# Patient Record
Sex: Female | Born: 1960
Health system: Southern US, Community
[De-identification: ages and names within clinical notes are randomized; demographics above are authoritative.]

## PROBLEM LIST (undated history)

## (undated) DIAGNOSIS — E785 Hyperlipidemia, unspecified: Secondary | ICD-10-CM

## (undated) DIAGNOSIS — M1990 Unspecified osteoarthritis, unspecified site: Secondary | ICD-10-CM

## (undated) DIAGNOSIS — I10 Essential (primary) hypertension: Secondary | ICD-10-CM

## (undated) DIAGNOSIS — R5383 Other fatigue: Secondary | ICD-10-CM

## (undated) DIAGNOSIS — E1140 Type 2 diabetes mellitus with diabetic neuropathy, unspecified: Secondary | ICD-10-CM

## (undated) DIAGNOSIS — R519 Headache, unspecified: Secondary | ICD-10-CM

## (undated) DIAGNOSIS — R51 Headache: Secondary | ICD-10-CM

## (undated) HISTORY — DX: Hyperlipidemia, unspecified: E78.5

## (undated) HISTORY — DX: Headache, unspecified: R51.9

## (undated) HISTORY — DX: Headache: R51

## (undated) HISTORY — DX: Type 2 diabetes mellitus with diabetic neuropathy, unspecified: E11.40

## (undated) HISTORY — DX: Other fatigue: R53.83

## (undated) HISTORY — DX: Essential (primary) hypertension: I10

## (undated) HISTORY — DX: Unspecified osteoarthritis, unspecified site: M19.90

---

## 2000-12-14 ENCOUNTER — Other Ambulatory Visit: Admission: RE | Admit: 2000-12-14 | Discharge: 2000-12-14 | Payer: Self-pay | Admitting: Gynecology

## 2002-03-03 ENCOUNTER — Other Ambulatory Visit: Admission: RE | Admit: 2002-03-03 | Discharge: 2002-03-03 | Payer: Self-pay | Admitting: Gynecology

## 2002-07-17 ENCOUNTER — Encounter (INDEPENDENT_AMBULATORY_CARE_PROVIDER_SITE_OTHER): Payer: Self-pay | Admitting: Specialist

## 2002-07-17 ENCOUNTER — Inpatient Hospital Stay (HOSPITAL_COMMUNITY): Admission: RE | Admit: 2002-07-17 | Discharge: 2002-07-19 | Payer: Self-pay | Admitting: Gynecology

## 2002-07-31 HISTORY — PX: ABDOMINAL HYSTERECTOMY: SHX81

## 2003-06-10 ENCOUNTER — Other Ambulatory Visit: Admission: RE | Admit: 2003-06-10 | Discharge: 2003-06-10 | Payer: Self-pay | Admitting: Gynecology

## 2006-02-21 ENCOUNTER — Other Ambulatory Visit: Admission: RE | Admit: 2006-02-21 | Discharge: 2006-02-21 | Payer: Self-pay | Admitting: Gynecology

## 2009-07-20 ENCOUNTER — Encounter: Admission: RE | Admit: 2009-07-20 | Discharge: 2009-07-20 | Payer: Self-pay | Admitting: Gynecology

## 2010-07-26 ENCOUNTER — Encounter
Admission: RE | Admit: 2010-07-26 | Discharge: 2010-07-26 | Payer: Self-pay | Source: Home / Self Care | Attending: Gynecology | Admitting: Gynecology

## 2010-08-21 ENCOUNTER — Encounter: Payer: Self-pay | Admitting: Gynecology

## 2010-12-16 NOTE — Op Note (Signed)
Barbara Forbes, Barbara Forbes                      ACCOUNT NO.:  [id number]   MEDICAL RECORD NO.:  [id number]                   PATIENT TYPE:  INP   LOCATION:  0453                                 FACILITY:  [facility]   PHYSICIAN:  Gretta Cool, M.D.              DATE OF BIRTH:  [birth year]   DATE OF PROCEDURE:  07/17/2002  DATE OF DISCHARGE:                                 OPERATIVE REPORT   SURGEON:  Gretta Cool, M.D.   ASSISTANT:  Raynald Kemp, M.D.   PREOPERATIVE DIAGNOSIS:  Uterine leiomyomata with abnormal uterine bleeding,  submucosal transmural myomata present.   POSTOPERATIVE DIAGNOSIS:  Uterine leiomyomata with abnormal uterine  bleeding, submucosal transmural myomata present.   PROCEDURE:  Supracervical hysterectomy.   ANESTHESIA:  General orotracheal.   DESCRIPTION OF PROCEDURE:  Under excellent general anesthesia as above, a  Pfannenstiel incision was made by excision of her previous scar.  The  incision was then extended through the fascia.  The rectus muscles were  separated in the midline, and the peritoneum opened.  There were no  significant adhesions from her previous cesarean section delivery.  A large  leiomyomata was present in the posterior wall of the uterus fundal wall.  There was no other significant pelvic or abdominal pathology.  At this  point, the uterus was elevated through the incision.  The round ligaments  sutured and transected.  The anterior leaf of the broad ligament was then  pushed off the lower uterine segment and the bladder was dissected free from  the cervix.  The ovarian ligaments on each side were then clamped, cut,  sutured, and tied with 0 Vicryl.  The uterine vessels were then  skeletonized, clamped, cut, sutured, and tied with 0 Vicryl.  At this point,  the cervix was incised in a conical incision so as to remove most of the  endocervical canal.  The remaining endocervical mucosa was then treated by  cautery.  At this point,  the cervix was approximated anterior to posterior  with a running mattress suture of 0 Vicryl.  At this point, all of the  bleeding was well controlled, it was dried.  The pelvis was irrigated with  lactated ringers.  The pelvic peritoneum was re-peritonealized with running  suture of #2-0 Vicryl.  The packs and retractors were then removed.  Pelvis  irrigated with lactated ringers.  There was no evidence of endometriosis or  other pathology.  The abdominoperitoneum was closed with a running suture of  #0 Monocryl.  The rectus muscles were plicated with 0 Monocryl as well.  The  rectus fascia was then approximated with running sutures of 0 Vicryl from  each angle to the midline.  Subcutaneous  tissues were approximated with interrupted sutures of 3-0 Vicryl.  Skin  closed with skin staples and Steri-Strips, and intermittent sutures of  interrupted mattress type.  At this point, the patient was returned to  the  recovery room in excellent condition without complication.                                                Gretta Cool, M.D.    CWL/MEDQ  D:  07/17/2002  T:  07/17/2002  Job:  [id number]   cc:   Donia Guiles, M.D.  [address] [place]  [place] [address]  Fax: [phone]

## 2010-12-16 NOTE — H&P (Signed)
NAMEDANAH, REINECKE                      ACCOUNT NO.:  [id number]   MEDICAL RECORD NO.:  [id number]                   PATIENT TYPE:  INP   LOCATION:  0453                                 FACILITY:  [facility]   PHYSICIAN:  Gretta Cool, M.D.              DATE OF BIRTH:  [birth year]   DATE OF ADMISSION:  07/17/2002  DATE OF DISCHARGE:                                HISTORY & PHYSICAL   CHIEF COMPLAINT:  Abnormal uterine bleeding.   HISTORY OF PRESENT ILLNESS:  The patient is a 50 year old gravida 1, para 1,  with type 1 diabetes since childhood.  She has over the last several years  developed increasingly severe menorrhagia, now lasting nine days.  She has  flow that occasionally comes through her clothing and her bed clothing.  She  also has severe discomfort with menstrual cycle, and wishes on to definitive  therapy.  We discussed all alternatives, including hysteroscopic resection  of uterine leiomyoma versus abdominal hysterectomy.   Her diabetes is under control, management by Dr. Corrin Parker, with multi-  dose insulin regimen.  He will follow her intraoperatively and  postoperatively.   PAST MEDICAL HISTORY:  1. Usual childhood disease without sequela.  2. Diabetes as above, type 1 since childhood.   PAST SURGICAL HISTORY:  1. Cesarean section delivery in 1989 for her only pregnancy.  2. Other hospitalizations for diabetes only.   FAMILY HISTORY:  Father died at age 16 of lung cancer.  Mother is alive and  well.   SOCIAL HISTORY:  She has a 40 year old child at home.  Husband is employed  by [employer].  She is a Education officer, environmental at [employer].   REVIEW OF SYMPTOMS:  HEENT:  Denies symptoms.  CARDIORESPIRATORY:  Denies  asthma, cough, black-out, or shortness of breath.  GASTROINTESTINAL:  Denies  frequency, urgency, dysuria, change in bowel habits, food intolerance.   PHYSICAL EXAMINATION:  GENERAL:  A well-developed, well-nourished white  female.  HEENT:  Pupils equal, round, reactive to light and accommodation.  Fundi are  benign.  Oropharynx clear.  NECK:  Supple without mass or thyromegaly.  CHEST:  Clear to auscultation and percussion.  BREASTS:  Soft without mass, nodes, or nipple discharge.  HEART:  Regular rhythm without murmur or cardiac enlargement.  ABDOMEN:  Soft, scaphoid without mass or organomegaly.  PELVIC:  External genitalia, normal female, vagina clean and rugous.  Cervix  is nulliparous.  Uterus is normal size, shape, contour.  Adnexa clear.  Rectovaginal confirms.  EXTREMITIES:  Negative.  NEUROLOGIC:  Physiologic.    IMPRESSION:  1. Uterine leiomyomata, transmural, probably non-resectable by hysteroscopic     approach.  2. Abnormal uterine bleeding secondary to #1.  3. Insulin-dependent diabetes type 1.   PLAN:  Total abdominal hysterectomy versus supracervical hysterectomy,  possible bilateral salpingo-oophorectomy.  Postoperative management of  diabetes by Dr. Dagoberto Ligas and his associates.  Gretta Cool, M.D.    CWL/MEDQ  D:  07/17/2002  T:  07/17/2002  Job:  [id number]   cc:   Alfonse Alpers. Dagoberto Ligas, M.D.  [address], [address]  [place]  [place] [address]  Fax: [phone]   Renae Fickle  [address]  [place]  [place] [address]  Fax: [phone]

## 2010-12-16 NOTE — Discharge Summary (Signed)
Barbara Forbes, Barbara Forbes                      ACCOUNT NO.:  [id number]   MEDICAL RECORD NO.:  [id number]                   PATIENT TYPE:  INP   LOCATION:  0453                                 FACILITY:  [facility]   PHYSICIAN:  Gretta Cool, M.D.              DATE OF BIRTH:  24-May-1961   DATE OF ADMISSION:  07/17/2002  DATE OF DISCHARGE:  07/19/2002                                 DISCHARGE SUMMARY   HISTORY OF PRESENT ILLNESS:  The patient is a 50 year old female, gravida 1,  para 1, who has developed over the last several years increasingly severe  menorrhagia, now lasting nine days.  She describes the flow as coming  through her clothing as well as her bed linen.  She also reports severe  discomfort with her menstrual cycles and wishes definitive therapy.  She is  a type 1 diabetic since childhood.  Discussion of treatments was done  including hysteroscopy, resection of the uterine leiomyomata versus  abdominal hysterectomy.   ADMISSION PHYSICAL EXAMINATION:  CHEST:  Clear to A&P.  HEART:  Heart rate and rhythm were regular.  Without murmur, gallop, or  cardiac enlargement.  ABDOMEN:  Soft and scaphoid.  Without masses or organomegaly.  PELVIC:  External genitalia within normal limits for female.  Vagina clean  and rugose.  Cervix is nulliparous and clean.  Uterus is normal shape, size,  and contour.  Adnexa bilaterally clear.  RECTOVAGINAL:  Confirms.   IMPRESSION:  1. Uterine leiomyomata, transmural, probably nonresectable by hysteroscopic     approach.  2. Abnormal uterine bleeding secondary to #1.  3. Insulin-dependent diabetes type 1.   PLAN:  Total abdominal hysterectomy versus supracervical hysterectomy,  possible bilateral salpingo-oophorectomy under general anesthesia.  Postoperative management of diabetes will be done by Dr. Dagoberto Ligas and his  associates.   LABORATORY DATA:  Admission hemoglobin 13.8, hematocrit 39.6.  On the first  postoperative day hemoglobin  was 13.2, hematocrit 38.9.  Routine  chemistries:  Preoperative glucose was elevated at 284.  Hemoglobin A1C was  8.  Urine pregnancy test negative.  Urine within normal limits with the  exception of glucose of greater than 1000.   EKG:  Normal.   HOSPITAL COURSE:  The patient underwent supracervical hysterectomy for  uterine leiomyomata with abnormal uterine bleeding, leiomyomata was  submucosal transmural.  The procedure was completed without any  complications, and the patient was returned to the recovery room in  excellent condition.  Pathology report:  Benign endocervix, benign secretory  endometrium, benign endometrial polyp without hyperplasia or malignancy  identified.  Intramural leiomyomata bulging into the cavity clinically.  Benign uterine serosa.  No endometriosis or malignancy identified.  Her  postoperative course was without complications, and she was discharged on  the second postoperative day in excellent condition.  On the day of  discharge her diabetes was well controlled, she was afebrile, and doing  well.   DISCHARGE INSTRUCTIONS:  Postoperative instructions included no heavy  lifting or straining, no vaginal entrance, increase ambulation as tolerated.  She is to call for any fever of over 100.5 or failure of daily improvement.   DIET:  Regular.   MEDICATIONS:  1. Vioxx 25 mg daily.  2. Tylox 1 p.o. q.4h. p.r.n. discomfort.   FOLLOW-UP:  She is to follow up in the office on July 21, 2002.   CONDITION ON DISCHARGE:  Excellent.   FINAL DISCHARGE DIAGNOSES:  1. Uterine leiomyomata with abnormal uterine bleeding.  2. Diabetes type 1.   PROCEDURE:  Supracervical hysterectomy under general anesthesia.     Matt Holmes, N.P.                          Gretta Cool, M.D.    EMK/MEDQ  D:  08/18/2002  T:  08/18/2002  Job:  [id number]   cc:   Donia Guiles, M.D.  [address] [place]  [place] [address]  Fax: [phone]   Alfonse Alpers. Dagoberto Ligas, M.D.   [address]  [place]  [place] [address]  Fax: [phone]   Renae Fickle  [address]  [place]  [place] [address]  Fax: [phone]

## 2011-02-17 ENCOUNTER — Other Ambulatory Visit: Payer: Self-pay | Admitting: Gynecology

## 2011-06-28 ENCOUNTER — Other Ambulatory Visit: Payer: Self-pay | Admitting: Gynecology

## 2011-06-28 DIAGNOSIS — Z1231 Encounter for screening mammogram for malignant neoplasm of breast: Secondary | ICD-10-CM

## 2011-07-28 ENCOUNTER — Ambulatory Visit: Payer: Self-pay

## 2011-08-18 ENCOUNTER — Ambulatory Visit
Admission: RE | Admit: 2011-08-18 | Discharge: 2011-08-18 | Disposition: A | Discharge status: Home / Self Care | Payer: BC Managed Care – PPO | Source: Ambulatory Visit | Attending: Gynecology | Admitting: Gynecology

## 2011-08-18 DIAGNOSIS — Z1231 Encounter for screening mammogram for malignant neoplasm of breast: Secondary | ICD-10-CM

## 2011-10-18 ENCOUNTER — Encounter: Payer: Self-pay | Admitting: Cardiovascular Disease

## 2011-12-27 ENCOUNTER — Ambulatory Visit: Payer: BC Managed Care – PPO | Admitting: Cardiology

## 2012-01-23 ENCOUNTER — Encounter: Payer: Self-pay | Admitting: Cardiovascular Disease

## 2012-01-23 ENCOUNTER — Ambulatory Visit (INDEPENDENT_AMBULATORY_CARE_PROVIDER_SITE_OTHER): Payer: BC Managed Care – PPO | Admitting: Cardiovascular Disease

## 2012-01-23 VITALS — BP 137/79 | HR 89 | Ht 62.0 in | Wt 161.0 lb

## 2012-01-23 DIAGNOSIS — R002 Palpitations: Secondary | ICD-10-CM | POA: Insufficient documentation

## 2012-01-23 DIAGNOSIS — R079 Chest pain, unspecified: Secondary | ICD-10-CM | POA: Insufficient documentation

## 2012-01-23 NOTE — Assessment & Plan Note (Signed)
She is a juvenile diabetic and has symptoms of chest pain that could represent angina. EKG is normal. Will arrange stress echo to exclude ischemia. I think imaging is important in addition to the stress EKG given her diabetes.

## 2012-01-23 NOTE — Progress Notes (Signed)
   History of Present Illness: 51 yo female with history of HTN, juvenile DM, gastroparesis, OA, HLD who is here today for evaluation of chest pressure, dizziness and arm numbness. She tells me that she has dizziness during the day for several months. This happens if she rushes around or goes on a long walk. She also describes pressure in the center of her chest while at rest and occasionally with exertion with associated numbness in her arms. Normal stress test in 1995. She also notes palpitations at night. She feels her heart racing.   Primary Care Physician: Renae Fickle  Past Medical History  Diagnosis Date  . Hypertension   . Head ache   . Fatigue   . Arthritis   . Diabetes mellitus     since age 54  . Hyperlipidemia   . Diabetic neuropathy     Past Surgical History  Procedure Date  . Cesarean section 1989  . Abdominal hysterectomy 2004    Current Outpatient Prescriptions  Medication Sig Dispense Refill  . aspirin 81 MG tablet Take 81 mg by mouth daily.      . Insulin Zinc Human (NOVOLIN L [place]) Inject into the skin. AS DIRECTED      . metoCLOPramide (REGLAN) 5 MG tablet AS NEEDED      . rosuvastatin (CRESTOR) 10 MG tablet Take 10 mg by mouth daily.      . valsartan (DIOVAN) 160 MG tablet Take 160 mg by mouth daily.        Allergies  Allergen Reactions  . Penicillins   . Sulfa Antibiotics     History   Social History  . Marital Status: Single    Spouse Name: N/A    Number of Children: 1  . Years of Education: N/A   Occupational History  . English Instructor/GED instructor    Social History Main Topics  . Smoking status: Never Smoker   . Smokeless tobacco: Not on file  . Alcohol Use: No  . Drug Use: No  . Sexually Active: Not on file   Other Topics Concern  . Not on file   Social History Narrative  . No narrative on file    Family History  Problem Relation Age of Onset  . Cancer Father     Lung/brain  . Stroke Mother   . Lupus Mother   . Coronary  artery disease Father     Review of Systems:  As stated in the HPI and otherwise negative.   BP 137/79  Pulse 89  Ht 5\' 2"  (1.575 m)  Wt 161 lb (73.029 kg)  BMI 29.45 kg/m2  Physical Examination: General: Well developed, well nourished, NAD HEENT: OP clear, mucus membranes moist SKIN: warm, dry. No rashes. Neuro: No focal deficits Musculoskeletal: Muscle strength 5/5 all ext Psychiatric: Mood and affect normal Neck: No JVD, no carotid bruits, no thyromegaly, no lymphadenopathy. Lungs:Clear bilaterally, no wheezes, rhonci, crackles Cardiovascular: Regular rate and rhythm. No murmurs, gallops or rubs. Abdomen:Soft. Bowel sounds present. Non-tender.  Extremities: No lower extremity edema. Pulses are 2 + in the bilateral DP/PT.  EKG: NSR, rate 89 bpm. Normal EKG

## 2012-01-23 NOTE — Patient Instructions (Signed)
Your physician recommends that you schedule a follow-up appointment in: 4-5 weeks.   Your physician has requested that you have a stress echocardiogram. For further information please visit [email or web address] Please follow instruction sheet as given.   Your physician has recommended that you wear a holter monitor. Holter monitors are medical devices that record the heart's electrical activity. Doctors most often use these monitors to diagnose arrhythmias. Arrhythmias are problems with the speed or rhythm of the heartbeat. The monitor is a small, portable device. You can wear one while you do your normal daily activities. This is usually used to diagnose what is causing palpitations/syncope (passing out).

## 2012-01-23 NOTE — Assessment & Plan Note (Signed)
Will arrange 48 hour monitor.

## 2012-02-13 ENCOUNTER — Encounter (INDEPENDENT_AMBULATORY_CARE_PROVIDER_SITE_OTHER): Payer: BC Managed Care – PPO

## 2012-02-13 DIAGNOSIS — R002 Palpitations: Secondary | ICD-10-CM

## 2012-02-16 ENCOUNTER — Encounter: Payer: Self-pay | Admitting: Cardiovascular Disease

## 2012-02-16 ENCOUNTER — Ambulatory Visit (HOSPITAL_COMMUNITY): Payer: BC Managed Care – PPO | Attending: Cardiovascular Disease | Admitting: Radiology

## 2012-02-16 ENCOUNTER — Telehealth: Payer: Self-pay | Admitting: Cardiovascular Disease

## 2012-02-16 ENCOUNTER — Ambulatory Visit (HOSPITAL_COMMUNITY): Payer: BC Managed Care – PPO

## 2012-02-16 DIAGNOSIS — I10 Essential (primary) hypertension: Secondary | ICD-10-CM | POA: Insufficient documentation

## 2012-02-16 DIAGNOSIS — E785 Hyperlipidemia, unspecified: Secondary | ICD-10-CM | POA: Insufficient documentation

## 2012-02-16 DIAGNOSIS — Z8249 Family history of ischemic heart disease and other diseases of the circulatory system: Secondary | ICD-10-CM | POA: Insufficient documentation

## 2012-02-16 DIAGNOSIS — R209 Unspecified disturbances of skin sensation: Secondary | ICD-10-CM | POA: Insufficient documentation

## 2012-02-16 DIAGNOSIS — R072 Precordial pain: Secondary | ICD-10-CM | POA: Insufficient documentation

## 2012-02-16 DIAGNOSIS — R002 Palpitations: Secondary | ICD-10-CM | POA: Insufficient documentation

## 2012-02-16 DIAGNOSIS — R5383 Other fatigue: Secondary | ICD-10-CM | POA: Insufficient documentation

## 2012-02-16 DIAGNOSIS — R5381 Other malaise: Secondary | ICD-10-CM | POA: Insufficient documentation

## 2012-02-16 DIAGNOSIS — R42 Dizziness and giddiness: Secondary | ICD-10-CM | POA: Insufficient documentation

## 2012-02-16 DIAGNOSIS — R000 Tachycardia, unspecified: Secondary | ICD-10-CM | POA: Insufficient documentation

## 2012-02-16 DIAGNOSIS — R079 Chest pain, unspecified: Secondary | ICD-10-CM | POA: Insufficient documentation

## 2012-02-16 DIAGNOSIS — E119 Type 2 diabetes mellitus without complications: Secondary | ICD-10-CM | POA: Insufficient documentation

## 2012-02-16 DIAGNOSIS — M79609 Pain in unspecified limb: Secondary | ICD-10-CM | POA: Insufficient documentation

## 2012-02-16 NOTE — Progress Notes (Signed)
Echocardiogram performed.  

## 2012-02-16 NOTE — Telephone Encounter (Signed)
Patient called no answer.LMTC. 

## 2012-02-16 NOTE — Telephone Encounter (Signed)
New problem:  Patient calling - test results  

## 2012-02-16 NOTE — Telephone Encounter (Signed)
Advised pt that since her test was just completed 4 hours ago the physician has not had an opportunity to fully review and interpret it at this point.  Advised her that we will definitely call her as soon as the results are available.  Pt agreed.

## 2012-02-20 ENCOUNTER — Telehealth: Payer: Self-pay | Admitting: Cardiovascular Disease

## 2012-02-20 NOTE — Telephone Encounter (Signed)
Fu call °Pt returning your call  °

## 2012-02-20 NOTE — Telephone Encounter (Signed)
Spoke with patient.  She is aware of stress echo results and will keep her follow up appointment

## 2012-02-22 ENCOUNTER — Telehealth: Payer: Self-pay

## 2012-02-22 NOTE — Telephone Encounter (Signed)
Pt was called per Dr Clifton James and given holder monitor results which were:  NSR, rare pac's and one short run of SVT (7 beats).

## 2012-03-07 ENCOUNTER — Encounter: Payer: Self-pay | Admitting: Cardiovascular Disease

## 2012-03-07 ENCOUNTER — Ambulatory Visit (INDEPENDENT_AMBULATORY_CARE_PROVIDER_SITE_OTHER): Payer: BC Managed Care – PPO | Admitting: Cardiovascular Disease

## 2012-03-07 VITALS — BP 146/85 | HR 100 | Ht 63.0 in | Wt 163.0 lb

## 2012-03-07 DIAGNOSIS — R079 Chest pain, unspecified: Secondary | ICD-10-CM

## 2012-03-07 DIAGNOSIS — R002 Palpitations: Secondary | ICD-10-CM

## 2012-03-07 MED ORDER — DILTIAZEM HCL ER COATED BEADS 120 MG PO CP24: 120.0000 mg | ORAL_CAPSULE | Freq: Every day | ORAL | Status: DC

## 2012-03-07 NOTE — Assessment & Plan Note (Signed)
Likely secondary to PACs and short runs of SVT. Will start Cardizem CD 120 mg po QDaily. She will call if there is a change in symptoms.

## 2012-03-07 NOTE — Progress Notes (Signed)
History of Present Illness: 51 yo female with history of HTN, juvenile DM, gastroparesis, OA, HLD who is here today for cardiac follow up. She was seen as a new patient in June 2013 for evaluation of chest pressure, dizziness and arm numbness. She told me that she has dizziness during the day for several months. This happens if she rushes around or goes on a long walk. She also describes pressure in the center of her chest while at rest and occasionally with exertion with associated numbness in her arms. Normal stress test in 1995. She also notes palpitations at night. She feels her heart racing. I arranged a stress echo which was normal and a 48 hour monitor which showed rare PACs with NSR and one 7 beat run of SVT.   She is here today for follow up. She is feeling well. No recurrent chest pain. Still feeling dizzy at times with occasional palpitations.   Primary Care Physician: Renae Fickle  Past Medical History  Diagnosis Date  . Hypertension   . Head ache   . Fatigue   . Arthritis   . Diabetes mellitus     since age 57  . Hyperlipidemia   . Diabetic neuropathy     Past Surgical History  Procedure Date  . Cesarean section 1989  . Abdominal hysterectomy 2004    Current Outpatient Prescriptions  Medication Sig Dispense Refill  . aspirin 81 MG tablet Take 81 mg by mouth daily.      . Insulin Zinc Human (NOVOLIN L [place]) Inject into the skin. AS DIRECTED      . metoCLOPramide (REGLAN) 5 MG tablet AS NEEDED      . rosuvastatin (CRESTOR) 10 MG tablet Take 10 mg by mouth daily.      . valsartan (DIOVAN) 160 MG tablet Take 160 mg by mouth daily.        Allergies  Allergen Reactions  . Penicillins   . Sulfa Antibiotics     History   Social History  . Marital Status: Single    Spouse Name: N/A    Number of Children: 1  . Years of Education: N/A   Occupational History  . English Instructor/GED instructor    Social History Main Topics  . Smoking status: Never Smoker   .  Smokeless tobacco: Not on file  . Alcohol Use: No  . Drug Use: No  . Sexually Active: Not on file   Other Topics Concern  . Not on file   Social History Narrative  . No narrative on file    Family History  Problem Relation Age of Onset  . Cancer Father     Lung/brain  . Stroke Mother   . Lupus Mother   . Coronary artery disease Father     Review of Systems:  As stated in the HPI and otherwise negative.   BP 146/85  Pulse 100  Ht 5\' 3"  (1.6 m)  Wt 163 lb (73.936 kg)  BMI 28.87 kg/m2  Physical Examination: General: Well developed, well nourished, NAD HEENT: OP clear, mucus membranes moist SKIN: warm, dry. No rashes. Neuro: No focal deficits Musculoskeletal: Muscle strength 5/5 all ext Psychiatric: Mood and affect normal Neck: No JVD, no carotid bruits, no thyromegaly, no lymphadenopathy. Lungs:Clear bilaterally, no wheezes, rhonci, crackles Cardiovascular: Regular rate and rhythm. No murmurs, gallops or rubs. Abdomen:Soft. Bowel sounds present. Non-tender.  Extremities: No lower extremity edema. Pulses are 2 + in the bilateral DP/PT.  Stress echo 02/16/12:  HPI and  indications: Exertional chest pressure. - Stress ECG conclusions: There were no stress arrhythmias or conduction abnormalities. The stress ECG was negative for ischemia. - Staged echo: There was no echocardiographic evidence for stress-induced ischemia.

## 2012-03-07 NOTE — Patient Instructions (Addendum)
Your physician wants you to follow-up in: 6 months. You will receive a reminder letter in the mail two months in advance. If you don't receive a letter, please call our office to schedule the follow-up appointment.  Your physician has recommended you make the following change in your medication:  Start Cardizem CD 120 mg by mouth daily   

## 2012-03-07 NOTE — Assessment & Plan Note (Addendum)
No evidence of ischemia on stress echo. She has long standing DM but her pain is atypical and not likely cardiac. She will call if symptoms change.

## 2012-03-22 ENCOUNTER — Telehealth: Payer: Self-pay | Admitting: Cardiovascular Disease

## 2012-03-22 NOTE — Telephone Encounter (Signed)
New Problem:    Patient needs a prescription of her diltiazem (CARDIZEM CD) 120 MG 24 hr capsule called in to her pharmacies [place] location.  Please call once the order has been placed.

## 2012-03-26 ENCOUNTER — Other Ambulatory Visit: Payer: Self-pay

## 2012-03-26 DIAGNOSIS — R002 Palpitations: Secondary | ICD-10-CM

## 2012-03-26 MED ORDER — DILTIAZEM HCL ER COATED BEADS 120 MG PO CP24: 120.0000 mg | ORAL_CAPSULE | Freq: Every day | ORAL | Status: DC

## 2012-06-26 ENCOUNTER — Other Ambulatory Visit: Payer: Self-pay | Admitting: Internal Medicine

## 2012-06-26 DIAGNOSIS — R259 Unspecified abnormal involuntary movements: Secondary | ICD-10-CM

## 2012-06-26 DIAGNOSIS — R209 Unspecified disturbances of skin sensation: Secondary | ICD-10-CM

## 2012-07-03 ENCOUNTER — Other Ambulatory Visit: Payer: Self-pay | Admitting: Gynecology

## 2012-07-06 ENCOUNTER — Ambulatory Visit
Admission: RE | Admit: 2012-07-06 | Discharge: 2012-07-06 | Disposition: A | Discharge status: Home / Self Care | Payer: BC Managed Care – PPO | Source: Ambulatory Visit | Attending: Internal Medicine | Admitting: Internal Medicine

## 2012-07-06 DIAGNOSIS — R209 Unspecified disturbances of skin sensation: Secondary | ICD-10-CM

## 2012-07-06 DIAGNOSIS — R259 Unspecified abnormal involuntary movements: Secondary | ICD-10-CM

## 2012-07-06 MED ORDER — GADOBENATE DIMEGLUMINE 529 MG/ML IV SOLN
15.0000 mL | Freq: Once | INTRAVENOUS | Status: AC | PRN
  Administered 2012-07-06: 15 mL via INTRAVENOUS

## 2012-11-14 ENCOUNTER — Other Ambulatory Visit: Payer: Self-pay

## 2012-11-14 ENCOUNTER — Ambulatory Visit
Admission: RE | Admit: 2012-11-14 | Discharge: 2012-11-14 | Disposition: A | Discharge status: Home / Self Care | Payer: BC Managed Care – PPO | Source: Ambulatory Visit

## 2012-11-14 DIAGNOSIS — Z1231 Encounter for screening mammogram for malignant neoplasm of breast: Secondary | ICD-10-CM

## 2013-10-09 ENCOUNTER — Ambulatory Visit
Admission: RE | Admit: 2013-10-09 | Discharge: 2013-10-09 | Disposition: A | Discharge status: Home / Self Care | Payer: BC Managed Care – PPO | Source: Ambulatory Visit | Attending: Internal Medicine | Admitting: Internal Medicine

## 2013-10-09 ENCOUNTER — Other Ambulatory Visit: Payer: Self-pay | Admitting: Internal Medicine

## 2013-10-09 DIAGNOSIS — R1310 Dysphagia, unspecified: Secondary | ICD-10-CM

## 2013-10-09 DIAGNOSIS — Z8349 Family history of other endocrine, nutritional and metabolic diseases: Secondary | ICD-10-CM

## 2013-10-13 ENCOUNTER — Other Ambulatory Visit: Payer: Self-pay | Admitting: Internal Medicine

## 2013-10-13 DIAGNOSIS — E041 Nontoxic single thyroid nodule: Secondary | ICD-10-CM

## 2013-10-14 ENCOUNTER — Other Ambulatory Visit (HOSPITAL_COMMUNITY)
Admission: RE | Admit: 2013-10-14 | Discharge: 2013-10-14 | Disposition: A | Discharge status: Home / Self Care | Payer: BC Managed Care – PPO | Source: Ambulatory Visit | Attending: Interventional Radiology | Admitting: Interventional Radiology

## 2013-10-14 ENCOUNTER — Ambulatory Visit
Admission: RE | Admit: 2013-10-14 | Discharge: 2013-10-14 | Disposition: A | Discharge status: Home / Self Care | Payer: BC Managed Care – PPO | Source: Ambulatory Visit | Attending: Internal Medicine | Admitting: Internal Medicine

## 2013-10-14 DIAGNOSIS — E041 Nontoxic single thyroid nodule: Secondary | ICD-10-CM | POA: Insufficient documentation

## 2013-10-30 ENCOUNTER — Encounter (INDEPENDENT_AMBULATORY_CARE_PROVIDER_SITE_OTHER): Payer: Self-pay | Admitting: Surgery

## 2013-10-30 ENCOUNTER — Ambulatory Visit (INDEPENDENT_AMBULATORY_CARE_PROVIDER_SITE_OTHER): Payer: BC Managed Care – PPO | Admitting: Surgery

## 2013-10-30 DIAGNOSIS — D440 Neoplasm of uncertain behavior of thyroid gland: Secondary | ICD-10-CM

## 2013-10-30 DIAGNOSIS — E063 Autoimmune thyroiditis: Secondary | ICD-10-CM | POA: Insufficient documentation

## 2013-10-30 DIAGNOSIS — D449 Neoplasm of uncertain behavior of unspecified endocrine gland: Secondary | ICD-10-CM

## 2013-10-30 NOTE — Progress Notes (Signed)
General Surgery [facility]  Chief Complaint  Patient presents with  . New Evaluation    Thyroid nodule of undetermined significance - referral from Dr. Delrae Rend    HISTORY: Patient is a 53 year old female referred by her endocrinologist for evaluation of left thyroid nodule with mild compressive symptoms. Patient has a long-standing history of thyroid disease having undergone radioactive iodine treatment as a young woman. She has been off of thyroid medication for many years. She has now developed mild hypothyroidism. A recent ultrasound was performed on 10/09/2013. This showed a normal sized thyroid gland with inhomogeneous parenchyma. There was a dominant nodule in the lower left lobe measuring 2.3 cm in size. Fine needle aspiration biopsy was obtained. This shows a follicular lesion of undetermined significance. Aspirate was very cellular. There was a microfollicular pattern. There were some nuclear changes including nuclear enlargement and nuclear overlap. There were also areas of lymphoid inflammation and germinal centers suggestive of lymphocytic thyroiditis. The lesion was categorized as a follicular lesion of undetermined significance.  Patient has noted some mild discomfort. She denies any choking. She denies any globus sensation.  Patient has had no prior head or neck surgery. There is no family history of thyroid malignancy. There is a family history of lymphocytic thyroiditis and hypothyroidism. There is no family history of other endocrine neoplasms.  Patient is an insulin-dependent diabetic.  Past Medical History  Diagnosis Date  . Hypertension   . Head ache   . Fatigue   . Arthritis   . Diabetes mellitus     since age 45  . Hyperlipidemia   . Diabetic neuropathy     Current Outpatient Prescriptions  Medication Sig Dispense Refill  . aspirin 81 MG tablet Take 81 mg by mouth daily.      . Insulin Aspart Prot & Aspart (NOVOLOG MIX 70/30 [place]) Inject  into the skin.      . Insulin Glargine (LANTUS [place]) Inject 30 Units/L into the skin.      . Levothyroxine Sodium (SYNTHROID PO) Take by mouth.      . metoCLOPramide (REGLAN) 5 MG tablet AS NEEDED      . Multiple Vitamin (MULTIVITAMIN) tablet Take 1 tablet by mouth daily.      Marland Kitchen PRAVASTATIN SODIUM PO Take by mouth.      . diltiazem (CARDIZEM CD) 120 MG 24 hr capsule Take 1 capsule (120 mg total) by mouth daily.  30 capsule  6  . valsartan (DIOVAN) 160 MG tablet Take 160 mg by mouth daily.       No current facility-administered medications for this visit.    Allergies  Allergen Reactions  . Penicillins   . Sulfa Antibiotics     Family History  Problem Relation Age of Onset  . Cancer Father     Lung/brain  . Stroke Mother   . Lupus Mother   . Coronary artery disease Father     History   Social History  . Marital Status: Married    Spouse Name: N/A    Number of Children: 1  . Years of Education: N/A   Occupational History  . English Instructor/GED instructor    Social History Main Topics  . Smoking status: Never Smoker   . Smokeless tobacco: None  . Alcohol Use: No  . Drug Use: No  . Sexual Activity: None   Other Topics Concern  . None   Social History Narrative  . None    REVIEW OF SYSTEMS -  PERTINENT POSITIVES ONLY: Denies tremor. Denies palpitation. Minor discomfort, no dysphagia, no globus sensation.  EXAM:  GENERAL: well-developed, well-nourished, no acute distress HEENT: normocephalic; pupils equal and reactive; sclerae clear; dentition good; mucous membranes moist NECK:  Palpable nodule inferior lobe, smooth, mobile with swallowing, nontender; asymmetric on extension; no palpable anterior or posterior cervical lymphadenopathy; no supraclavicular masses; no tenderness CHEST: clear to auscultation bilaterally without rales, rhonchi, or wheezes CARDIAC: regular rate and rhythm without significant murmur; peripheral pulses are full EXT:  non-tender without  edema; no deformity NEURO: no gross focal deficits; no sign of tremor   LABORATORY RESULTS: See [facility] (CHL-Epic) for most recent results  RADIOLOGY RESULTS: See [facility] (CHL-Epic) for most recent results  IMPRESSION: #1 dominant thyroid nodule, 2.3 cm, with cytopathology showing follicular lesion of undetermined significance #2 probable underlying lymphocytic thyroiditis with mild hypothyroidism  PLAN: I discussed all the above findings at length with the patient and her friend who accompanied her today. We reviewed the cytopathology report and the ultrasound in detail. I explained to her that the follicular lesion of undetermined significance is not necessarily benign. I told her the risk of malignancy was probably in the 15-20% range.  We discussed options for management. Surgical options include thyroid lobectomy for definitive diagnosis and completion thyroidectomy if malignancy is identified.  Alternatively, we could place her on close observation with a repeat thyroid ultrasound in 6 months. Her endocrinologist will continue to monitor her TSH level and adjust her thyroid hormone dosage accordingly.  The patient and I discussed thyroid surgery at length. We discussed risk and benefits including the risk of injury to recurrent laryngeal nerves and injury to parathyroid glands. We discussed the hospital stay to be anticipated. We discussed the recovery and return to work. We discussed the potential need for radioactive iodine treatment.  Patient would like to consider these options. She will contact me if she wishes to proceed with surgery. Otherwise we will plan to see her back in 6 months with repeat thyroid ultrasound for physical examination.  Earnstine Regal, MD, [facility]  Primary Care Physician: Wende Neighbors, MD

## 2013-10-30 NOTE — Patient Instructions (Signed)

## 2013-11-17 ENCOUNTER — Other Ambulatory Visit: Payer: Self-pay

## 2013-11-17 DIAGNOSIS — Z1231 Encounter for screening mammogram for malignant neoplasm of breast: Secondary | ICD-10-CM

## 2013-11-20 ENCOUNTER — Ambulatory Visit
Admission: RE | Admit: 2013-11-20 | Discharge: 2013-11-20 | Disposition: A | Discharge status: Home / Self Care | Payer: Self-pay | Source: Ambulatory Visit

## 2013-11-20 DIAGNOSIS — Z1231 Encounter for screening mammogram for malignant neoplasm of breast: Secondary | ICD-10-CM

## 2014-10-26 ENCOUNTER — Other Ambulatory Visit: Payer: Self-pay

## 2014-10-26 DIAGNOSIS — Z1231 Encounter for screening mammogram for malignant neoplasm of breast: Secondary | ICD-10-CM

## 2014-11-30 ENCOUNTER — Ambulatory Visit
Admission: RE | Admit: 2014-11-30 | Discharge: 2014-11-30 | Disposition: A | Discharge status: Home / Self Care | Payer: BLUE CROSS/BLUE SHIELD | Source: Ambulatory Visit

## 2014-11-30 DIAGNOSIS — Z1231 Encounter for screening mammogram for malignant neoplasm of breast: Secondary | ICD-10-CM

## 2015-01-18 ENCOUNTER — Other Ambulatory Visit: Payer: Self-pay | Admitting: Obstetrics & Gynecology

## 2015-01-19 LAB — CYTOLOGY - PAP

## 2015-11-19 DIAGNOSIS — E118 Type 2 diabetes mellitus with unspecified complications: Secondary | ICD-10-CM | POA: Diagnosis not present

## 2015-11-19 DIAGNOSIS — E109 Type 1 diabetes mellitus without complications: Secondary | ICD-10-CM | POA: Diagnosis not present

## 2016-01-17 DIAGNOSIS — E041 Nontoxic single thyroid nodule: Secondary | ICD-10-CM | POA: Diagnosis not present

## 2016-01-20 ENCOUNTER — Other Ambulatory Visit: Payer: Self-pay | Admitting: Obstetrics & Gynecology

## 2016-01-20 ENCOUNTER — Other Ambulatory Visit: Payer: Self-pay

## 2016-01-20 DIAGNOSIS — Z6829 Body mass index (BMI) 29.0-29.9, adult: Secondary | ICD-10-CM | POA: Diagnosis not present

## 2016-01-20 DIAGNOSIS — N63 Unspecified lump in unspecified breast: Secondary | ICD-10-CM

## 2016-01-20 DIAGNOSIS — Z01419 Encounter for gynecological examination (general) (routine) without abnormal findings: Secondary | ICD-10-CM | POA: Diagnosis not present

## 2016-01-20 DIAGNOSIS — Z1231 Encounter for screening mammogram for malignant neoplasm of breast: Secondary | ICD-10-CM

## 2016-01-25 ENCOUNTER — Ambulatory Visit
Admission: RE | Admit: 2016-01-25 | Discharge: 2016-01-25 | Disposition: A | Discharge status: Home / Self Care | Payer: BLUE CROSS/BLUE SHIELD | Source: Ambulatory Visit | Attending: Obstetrics & Gynecology | Admitting: Obstetrics & Gynecology

## 2016-01-25 DIAGNOSIS — N6489 Other specified disorders of breast: Secondary | ICD-10-CM | POA: Diagnosis not present

## 2016-01-25 DIAGNOSIS — N63 Unspecified lump in unspecified breast: Secondary | ICD-10-CM

## 2016-01-25 DIAGNOSIS — R928 Other abnormal and inconclusive findings on diagnostic imaging of breast: Secondary | ICD-10-CM | POA: Diagnosis not present

## 2016-01-27 ENCOUNTER — Other Ambulatory Visit: Payer: BLUE CROSS/BLUE SHIELD

## 2016-02-14 DIAGNOSIS — E785 Hyperlipidemia, unspecified: Secondary | ICD-10-CM | POA: Diagnosis not present

## 2016-02-14 DIAGNOSIS — I10 Essential (primary) hypertension: Secondary | ICD-10-CM | POA: Diagnosis not present

## 2016-02-14 DIAGNOSIS — E669 Obesity, unspecified: Secondary | ICD-10-CM | POA: Diagnosis not present

## 2016-02-14 DIAGNOSIS — E109 Type 1 diabetes mellitus without complications: Secondary | ICD-10-CM | POA: Diagnosis not present

## 2016-03-14 DIAGNOSIS — E789 Disorder of lipoprotein metabolism, unspecified: Secondary | ICD-10-CM | POA: Diagnosis not present

## 2016-03-14 DIAGNOSIS — E118 Type 2 diabetes mellitus with unspecified complications: Secondary | ICD-10-CM | POA: Diagnosis not present

## 2016-03-16 DIAGNOSIS — E118 Type 2 diabetes mellitus with unspecified complications: Secondary | ICD-10-CM | POA: Diagnosis not present

## 2016-03-16 DIAGNOSIS — E032 Hypothyroidism due to medicaments and other exogenous substances: Secondary | ICD-10-CM | POA: Diagnosis not present

## 2016-03-16 DIAGNOSIS — E789 Disorder of lipoprotein metabolism, unspecified: Secondary | ICD-10-CM | POA: Diagnosis not present

## 2016-05-31 DIAGNOSIS — L219 Seborrheic dermatitis, unspecified: Secondary | ICD-10-CM | POA: Diagnosis not present

## 2016-06-08 DIAGNOSIS — E0789 Other specified disorders of thyroid: Secondary | ICD-10-CM | POA: Diagnosis not present

## 2016-06-08 DIAGNOSIS — E118 Type 2 diabetes mellitus with unspecified complications: Secondary | ICD-10-CM | POA: Diagnosis not present

## 2016-06-08 DIAGNOSIS — E789 Disorder of lipoprotein metabolism, unspecified: Secondary | ICD-10-CM | POA: Diagnosis not present

## 2016-06-15 DIAGNOSIS — E109 Type 1 diabetes mellitus without complications: Secondary | ICD-10-CM | POA: Diagnosis not present

## 2016-06-15 DIAGNOSIS — Z23 Encounter for immunization: Secondary | ICD-10-CM | POA: Diagnosis not present

## 2016-06-15 DIAGNOSIS — I10 Essential (primary) hypertension: Secondary | ICD-10-CM | POA: Diagnosis not present

## 2016-06-15 DIAGNOSIS — E032 Hypothyroidism due to medicaments and other exogenous substances: Secondary | ICD-10-CM | POA: Diagnosis not present

## 2016-07-04 DIAGNOSIS — E118 Type 2 diabetes mellitus with unspecified complications: Secondary | ICD-10-CM | POA: Diagnosis not present

## 2016-07-05 DIAGNOSIS — D225 Melanocytic nevi of trunk: Secondary | ICD-10-CM | POA: Diagnosis not present

## 2016-07-05 DIAGNOSIS — L219 Seborrheic dermatitis, unspecified: Secondary | ICD-10-CM | POA: Diagnosis not present

## 2016-07-05 DIAGNOSIS — L821 Other seborrheic keratosis: Secondary | ICD-10-CM | POA: Diagnosis not present

## 2016-07-05 DIAGNOSIS — D485 Neoplasm of uncertain behavior of skin: Secondary | ICD-10-CM | POA: Diagnosis not present

## 2016-07-10 DIAGNOSIS — E109 Type 1 diabetes mellitus without complications: Secondary | ICD-10-CM | POA: Diagnosis not present

## 2016-07-17 DIAGNOSIS — E109 Type 1 diabetes mellitus without complications: Secondary | ICD-10-CM | POA: Diagnosis not present

## 2016-08-10 DIAGNOSIS — E109 Type 1 diabetes mellitus without complications: Secondary | ICD-10-CM | POA: Diagnosis not present

## 2016-08-22 DIAGNOSIS — E109 Type 1 diabetes mellitus without complications: Secondary | ICD-10-CM | POA: Diagnosis not present

## 2016-10-06 DIAGNOSIS — E0789 Other specified disorders of thyroid: Secondary | ICD-10-CM | POA: Diagnosis not present

## 2016-10-06 DIAGNOSIS — E118 Type 2 diabetes mellitus with unspecified complications: Secondary | ICD-10-CM | POA: Diagnosis not present

## 2016-10-06 DIAGNOSIS — E789 Disorder of lipoprotein metabolism, unspecified: Secondary | ICD-10-CM | POA: Diagnosis not present

## 2016-10-11 DIAGNOSIS — E032 Hypothyroidism due to medicaments and other exogenous substances: Secondary | ICD-10-CM | POA: Diagnosis not present

## 2016-10-11 DIAGNOSIS — E109 Type 1 diabetes mellitus without complications: Secondary | ICD-10-CM | POA: Diagnosis not present

## 2016-10-11 DIAGNOSIS — E789 Disorder of lipoprotein metabolism, unspecified: Secondary | ICD-10-CM | POA: Diagnosis not present

## 2016-11-06 DIAGNOSIS — Z6830 Body mass index (BMI) 30.0-30.9, adult: Secondary | ICD-10-CM | POA: Diagnosis not present

## 2016-11-06 DIAGNOSIS — I10 Essential (primary) hypertension: Secondary | ICD-10-CM | POA: Diagnosis not present

## 2016-11-06 DIAGNOSIS — J301 Allergic rhinitis due to pollen: Secondary | ICD-10-CM | POA: Diagnosis not present

## 2016-11-06 DIAGNOSIS — E109 Type 1 diabetes mellitus without complications: Secondary | ICD-10-CM | POA: Diagnosis not present

## 2016-11-16 DIAGNOSIS — H25813 Combined forms of age-related cataract, bilateral: Secondary | ICD-10-CM | POA: Diagnosis not present

## 2016-11-16 DIAGNOSIS — E103293 Type 1 diabetes mellitus with mild nonproliferative diabetic retinopathy without macular edema, bilateral: Secondary | ICD-10-CM | POA: Diagnosis not present

## 2016-11-16 DIAGNOSIS — H4321 Crystalline deposits in vitreous body, right eye: Secondary | ICD-10-CM | POA: Diagnosis not present

## 2016-11-16 DIAGNOSIS — Z83511 Family history of glaucoma: Secondary | ICD-10-CM | POA: Diagnosis not present

## 2017-01-16 DIAGNOSIS — E032 Hypothyroidism due to medicaments and other exogenous substances: Secondary | ICD-10-CM | POA: Diagnosis not present

## 2017-01-16 DIAGNOSIS — E789 Disorder of lipoprotein metabolism, unspecified: Secondary | ICD-10-CM | POA: Diagnosis not present

## 2017-01-16 DIAGNOSIS — E109 Type 1 diabetes mellitus without complications: Secondary | ICD-10-CM | POA: Diagnosis not present

## 2017-01-17 DIAGNOSIS — E041 Nontoxic single thyroid nodule: Secondary | ICD-10-CM | POA: Diagnosis not present

## 2017-01-23 DIAGNOSIS — Z6832 Body mass index (BMI) 32.0-32.9, adult: Secondary | ICD-10-CM | POA: Diagnosis not present

## 2017-01-23 DIAGNOSIS — Z01419 Encounter for gynecological examination (general) (routine) without abnormal findings: Secondary | ICD-10-CM | POA: Diagnosis not present

## 2017-02-07 ENCOUNTER — Other Ambulatory Visit: Payer: Self-pay | Admitting: Obstetrics & Gynecology

## 2017-02-07 DIAGNOSIS — Z1231 Encounter for screening mammogram for malignant neoplasm of breast: Secondary | ICD-10-CM

## 2017-02-20 ENCOUNTER — Ambulatory Visit: Payer: BLUE CROSS/BLUE SHIELD

## 2017-02-22 ENCOUNTER — Ambulatory Visit
Admission: RE | Admit: 2017-02-22 | Discharge: 2017-02-22 | Disposition: A | Discharge status: Home / Self Care | Payer: BLUE CROSS/BLUE SHIELD | Source: Ambulatory Visit | Attending: Obstetrics & Gynecology | Admitting: Obstetrics & Gynecology

## 2017-02-22 DIAGNOSIS — Z1231 Encounter for screening mammogram for malignant neoplasm of breast: Secondary | ICD-10-CM

## 2017-03-13 DIAGNOSIS — E789 Disorder of lipoprotein metabolism, unspecified: Secondary | ICD-10-CM | POA: Diagnosis not present

## 2017-03-13 DIAGNOSIS — E118 Type 2 diabetes mellitus with unspecified complications: Secondary | ICD-10-CM | POA: Diagnosis not present

## 2017-03-13 DIAGNOSIS — E0789 Other specified disorders of thyroid: Secondary | ICD-10-CM | POA: Diagnosis not present

## 2017-03-20 DIAGNOSIS — F4323 Adjustment disorder with mixed anxiety and depressed mood: Secondary | ICD-10-CM | POA: Diagnosis not present

## 2017-05-15 DIAGNOSIS — E785 Hyperlipidemia, unspecified: Secondary | ICD-10-CM | POA: Diagnosis not present

## 2017-05-15 DIAGNOSIS — Z23 Encounter for immunization: Secondary | ICD-10-CM | POA: Diagnosis not present

## 2017-05-15 DIAGNOSIS — I10 Essential (primary) hypertension: Secondary | ICD-10-CM | POA: Diagnosis not present

## 2017-05-15 DIAGNOSIS — H6120 Impacted cerumen, unspecified ear: Secondary | ICD-10-CM | POA: Diagnosis not present

## 2017-05-15 DIAGNOSIS — E109 Type 1 diabetes mellitus without complications: Secondary | ICD-10-CM | POA: Diagnosis not present

## 2017-07-18 DIAGNOSIS — E042 Nontoxic multinodular goiter: Secondary | ICD-10-CM | POA: Diagnosis not present

## 2017-07-18 DIAGNOSIS — E1065 Type 1 diabetes mellitus with hyperglycemia: Secondary | ICD-10-CM | POA: Diagnosis not present

## 2017-07-18 DIAGNOSIS — Z794 Long term (current) use of insulin: Secondary | ICD-10-CM | POA: Diagnosis not present

## 2017-07-30 DIAGNOSIS — Z6829 Body mass index (BMI) 29.0-29.9, adult: Secondary | ICD-10-CM | POA: Diagnosis not present

## 2017-07-30 DIAGNOSIS — L02214 Cutaneous abscess of groin: Secondary | ICD-10-CM | POA: Diagnosis not present

## 2017-08-08 DIAGNOSIS — E063 Autoimmune thyroiditis: Secondary | ICD-10-CM | POA: Diagnosis not present

## 2017-08-08 DIAGNOSIS — E103293 Type 1 diabetes mellitus with mild nonproliferative diabetic retinopathy without macular edema, bilateral: Secondary | ICD-10-CM | POA: Diagnosis not present

## 2017-08-08 DIAGNOSIS — E038 Other specified hypothyroidism: Secondary | ICD-10-CM | POA: Diagnosis not present

## 2017-09-15 DIAGNOSIS — E103293 Type 1 diabetes mellitus with mild nonproliferative diabetic retinopathy without macular edema, bilateral: Secondary | ICD-10-CM | POA: Diagnosis not present

## 2017-11-08 DIAGNOSIS — E063 Autoimmune thyroiditis: Secondary | ICD-10-CM | POA: Diagnosis not present

## 2017-11-08 DIAGNOSIS — Z7984 Long term (current) use of oral hypoglycemic drugs: Secondary | ICD-10-CM | POA: Diagnosis not present

## 2017-11-08 DIAGNOSIS — E103293 Type 1 diabetes mellitus with mild nonproliferative diabetic retinopathy without macular edema, bilateral: Secondary | ICD-10-CM | POA: Diagnosis not present

## 2017-11-08 DIAGNOSIS — E038 Other specified hypothyroidism: Secondary | ICD-10-CM | POA: Diagnosis not present

## 2017-12-05 DIAGNOSIS — F419 Anxiety disorder, unspecified: Secondary | ICD-10-CM | POA: Diagnosis not present

## 2017-12-06 DIAGNOSIS — E103293 Type 1 diabetes mellitus with mild nonproliferative diabetic retinopathy without macular edema, bilateral: Secondary | ICD-10-CM | POA: Diagnosis not present

## 2017-12-06 DIAGNOSIS — H4321 Crystalline deposits in vitreous body, right eye: Secondary | ICD-10-CM | POA: Diagnosis not present

## 2017-12-06 DIAGNOSIS — H25813 Combined forms of age-related cataract, bilateral: Secondary | ICD-10-CM | POA: Diagnosis not present

## 2018-01-09 DIAGNOSIS — E063 Autoimmune thyroiditis: Secondary | ICD-10-CM | POA: Diagnosis not present

## 2018-01-09 DIAGNOSIS — E041 Nontoxic single thyroid nodule: Secondary | ICD-10-CM | POA: Diagnosis not present

## 2018-01-09 DIAGNOSIS — E038 Other specified hypothyroidism: Secondary | ICD-10-CM | POA: Diagnosis not present

## 2018-01-09 DIAGNOSIS — R5383 Other fatigue: Secondary | ICD-10-CM | POA: Diagnosis not present

## 2018-01-16 DIAGNOSIS — F419 Anxiety disorder, unspecified: Secondary | ICD-10-CM | POA: Diagnosis not present

## 2018-01-21 ENCOUNTER — Other Ambulatory Visit: Payer: Self-pay | Admitting: Obstetrics and Gynecology

## 2018-01-21 ENCOUNTER — Other Ambulatory Visit: Payer: Self-pay | Admitting: Obstetrics & Gynecology

## 2018-01-21 DIAGNOSIS — Z1231 Encounter for screening mammogram for malignant neoplasm of breast: Secondary | ICD-10-CM

## 2018-02-07 DIAGNOSIS — E782 Mixed hyperlipidemia: Secondary | ICD-10-CM | POA: Diagnosis not present

## 2018-02-07 DIAGNOSIS — E063 Autoimmune thyroiditis: Secondary | ICD-10-CM | POA: Diagnosis not present

## 2018-02-07 DIAGNOSIS — E038 Other specified hypothyroidism: Secondary | ICD-10-CM | POA: Diagnosis not present

## 2018-02-07 DIAGNOSIS — E103293 Type 1 diabetes mellitus with mild nonproliferative diabetic retinopathy without macular edema, bilateral: Secondary | ICD-10-CM | POA: Diagnosis not present

## 2018-03-11 ENCOUNTER — Ambulatory Visit: Payer: BLUE CROSS/BLUE SHIELD

## 2018-03-14 DIAGNOSIS — I10 Essential (primary) hypertension: Secondary | ICD-10-CM | POA: Diagnosis not present

## 2018-03-14 DIAGNOSIS — E559 Vitamin D deficiency, unspecified: Secondary | ICD-10-CM | POA: Diagnosis not present

## 2018-03-14 DIAGNOSIS — E538 Deficiency of other specified B group vitamins: Secondary | ICD-10-CM | POA: Diagnosis not present

## 2018-03-14 DIAGNOSIS — J029 Acute pharyngitis, unspecified: Secondary | ICD-10-CM | POA: Diagnosis not present

## 2018-03-14 DIAGNOSIS — J301 Allergic rhinitis due to pollen: Secondary | ICD-10-CM | POA: Diagnosis not present

## 2018-03-14 DIAGNOSIS — J189 Pneumonia, unspecified organism: Secondary | ICD-10-CM | POA: Diagnosis not present

## 2018-03-14 DIAGNOSIS — J019 Acute sinusitis, unspecified: Secondary | ICD-10-CM | POA: Diagnosis not present

## 2018-03-19 DIAGNOSIS — Z1231 Encounter for screening mammogram for malignant neoplasm of breast: Secondary | ICD-10-CM | POA: Diagnosis not present

## 2018-03-21 DIAGNOSIS — E103293 Type 1 diabetes mellitus with mild nonproliferative diabetic retinopathy without macular edema, bilateral: Secondary | ICD-10-CM | POA: Diagnosis not present

## 2018-05-17 DIAGNOSIS — Z23 Encounter for immunization: Secondary | ICD-10-CM | POA: Diagnosis not present

## 2018-05-17 DIAGNOSIS — R079 Chest pain, unspecified: Secondary | ICD-10-CM | POA: Diagnosis not present

## 2018-05-17 DIAGNOSIS — M542 Cervicalgia: Secondary | ICD-10-CM | POA: Diagnosis not present

## 2018-05-20 DIAGNOSIS — F4321 Adjustment disorder with depressed mood: Secondary | ICD-10-CM | POA: Diagnosis not present

## 2018-05-22 DIAGNOSIS — E109 Type 1 diabetes mellitus without complications: Secondary | ICD-10-CM | POA: Diagnosis not present

## 2018-05-22 DIAGNOSIS — G4700 Insomnia, unspecified: Secondary | ICD-10-CM | POA: Diagnosis not present

## 2018-05-22 DIAGNOSIS — F419 Anxiety disorder, unspecified: Secondary | ICD-10-CM | POA: Diagnosis not present

## 2018-05-22 DIAGNOSIS — I10 Essential (primary) hypertension: Secondary | ICD-10-CM | POA: Diagnosis not present

## 2018-05-24 DIAGNOSIS — E782 Mixed hyperlipidemia: Secondary | ICD-10-CM | POA: Diagnosis not present

## 2018-05-24 DIAGNOSIS — E063 Autoimmune thyroiditis: Secondary | ICD-10-CM | POA: Diagnosis not present

## 2018-05-24 DIAGNOSIS — E038 Other specified hypothyroidism: Secondary | ICD-10-CM | POA: Diagnosis not present

## 2018-05-24 DIAGNOSIS — E103293 Type 1 diabetes mellitus with mild nonproliferative diabetic retinopathy without macular edema, bilateral: Secondary | ICD-10-CM | POA: Diagnosis not present

## 2018-05-27 DIAGNOSIS — F4321 Adjustment disorder with depressed mood: Secondary | ICD-10-CM | POA: Diagnosis not present

## 2018-06-10 ENCOUNTER — Encounter (HOSPITAL_COMMUNITY): Payer: Self-pay | Admitting: Emergency Medicine

## 2018-06-10 ENCOUNTER — Emergency Department (HOSPITAL_COMMUNITY)
Admission: EM | Admit: 2018-06-10 | Discharge: 2018-06-11 | Disposition: A | Discharge status: Other Acute Inpatient Hospital | Payer: BLUE CROSS/BLUE SHIELD | Source: Home / Self Care | Attending: Emergency Medicine | Admitting: Emergency Medicine

## 2018-06-10 ENCOUNTER — Other Ambulatory Visit: Payer: Self-pay

## 2018-06-10 DIAGNOSIS — Z794 Long term (current) use of insulin: Secondary | ICD-10-CM | POA: Insufficient documentation

## 2018-06-10 DIAGNOSIS — F333 Major depressive disorder, recurrent, severe with psychotic symptoms: Secondary | ICD-10-CM

## 2018-06-10 DIAGNOSIS — R0789 Other chest pain: Secondary | ICD-10-CM | POA: Diagnosis not present

## 2018-06-10 DIAGNOSIS — E119 Type 2 diabetes mellitus without complications: Secondary | ICD-10-CM | POA: Insufficient documentation

## 2018-06-10 DIAGNOSIS — F22 Delusional disorders: Secondary | ICD-10-CM

## 2018-06-10 DIAGNOSIS — Z880 Allergy status to penicillin: Secondary | ICD-10-CM | POA: Diagnosis not present

## 2018-06-10 DIAGNOSIS — R05 Cough: Secondary | ICD-10-CM | POA: Diagnosis not present

## 2018-06-10 DIAGNOSIS — E785 Hyperlipidemia, unspecified: Secondary | ICD-10-CM | POA: Diagnosis not present

## 2018-06-10 DIAGNOSIS — Z79899 Other long term (current) drug therapy: Secondary | ICD-10-CM

## 2018-06-10 DIAGNOSIS — I10 Essential (primary) hypertension: Secondary | ICD-10-CM

## 2018-06-10 DIAGNOSIS — G4700 Insomnia, unspecified: Secondary | ICD-10-CM | POA: Diagnosis not present

## 2018-06-10 DIAGNOSIS — F29 Unspecified psychosis not due to a substance or known physiological condition: Secondary | ICD-10-CM

## 2018-06-10 DIAGNOSIS — F419 Anxiety disorder, unspecified: Secondary | ICD-10-CM | POA: Diagnosis not present

## 2018-06-10 DIAGNOSIS — R45851 Suicidal ideations: Secondary | ICD-10-CM

## 2018-06-10 DIAGNOSIS — Z882 Allergy status to sulfonamides status: Secondary | ICD-10-CM | POA: Diagnosis not present

## 2018-06-10 DIAGNOSIS — E1140 Type 2 diabetes mellitus with diabetic neuropathy, unspecified: Secondary | ICD-10-CM | POA: Diagnosis not present

## 2018-06-10 DIAGNOSIS — Z818 Family history of other mental and behavioral disorders: Secondary | ICD-10-CM | POA: Diagnosis not present

## 2018-06-10 NOTE — ED Triage Notes (Signed)
Pt presents with husband with complaints of suicidal ideation. Per husband, patient has had "odd behavior" since last Sunday. Patient withdrawn and unwilling to offer much information. Per husband patient has been paranoid, patient agreed. Patient states that she has felt like people are following her and out to get her. Patient stated to husband earlier "I should've just hit that bridge, things would be better off without me."

## 2018-06-10 NOTE — ED Notes (Signed)
Bed: ZYT4 Expected date: 06/11/18 Expected time: 11:00 AM Means of arrival:  Comments:

## 2018-06-11 ENCOUNTER — Inpatient Hospital Stay (HOSPITAL_COMMUNITY)
Admission: AD | Admit: 2018-06-11 | Discharge: 2018-06-17 | DRG: 885 | Disposition: A | Discharge status: Home / Self Care | Payer: BLUE CROSS/BLUE SHIELD | Source: Intra-hospital | Attending: Psychiatry | Admitting: Psychiatry

## 2018-06-11 ENCOUNTER — Encounter (HOSPITAL_COMMUNITY): Payer: Self-pay | Admitting: *Deleted

## 2018-06-11 ENCOUNTER — Emergency Department (HOSPITAL_COMMUNITY): Payer: BLUE CROSS/BLUE SHIELD

## 2018-06-11 DIAGNOSIS — F419 Anxiety disorder, unspecified: Secondary | ICD-10-CM | POA: Diagnosis present

## 2018-06-11 DIAGNOSIS — I10 Essential (primary) hypertension: Secondary | ICD-10-CM | POA: Diagnosis present

## 2018-06-11 DIAGNOSIS — E1140 Type 2 diabetes mellitus with diabetic neuropathy, unspecified: Secondary | ICD-10-CM | POA: Diagnosis present

## 2018-06-11 DIAGNOSIS — E785 Hyperlipidemia, unspecified: Secondary | ICD-10-CM | POA: Diagnosis present

## 2018-06-11 DIAGNOSIS — F333 Major depressive disorder, recurrent, severe with psychotic symptoms: Principal | ICD-10-CM | POA: Diagnosis present

## 2018-06-11 DIAGNOSIS — Z79899 Other long term (current) drug therapy: Secondary | ICD-10-CM | POA: Diagnosis not present

## 2018-06-11 DIAGNOSIS — Z880 Allergy status to penicillin: Secondary | ICD-10-CM

## 2018-06-11 DIAGNOSIS — Z882 Allergy status to sulfonamides status: Secondary | ICD-10-CM

## 2018-06-11 DIAGNOSIS — Z794 Long term (current) use of insulin: Secondary | ICD-10-CM | POA: Diagnosis not present

## 2018-06-11 DIAGNOSIS — F29 Unspecified psychosis not due to a substance or known physiological condition: Secondary | ICD-10-CM | POA: Diagnosis not present

## 2018-06-11 DIAGNOSIS — Z818 Family history of other mental and behavioral disorders: Secondary | ICD-10-CM

## 2018-06-11 DIAGNOSIS — R0789 Other chest pain: Secondary | ICD-10-CM | POA: Diagnosis not present

## 2018-06-11 DIAGNOSIS — R05 Cough: Secondary | ICD-10-CM | POA: Diagnosis not present

## 2018-06-11 DIAGNOSIS — G4700 Insomnia, unspecified: Secondary | ICD-10-CM | POA: Diagnosis present

## 2018-06-11 DIAGNOSIS — R45851 Suicidal ideations: Secondary | ICD-10-CM | POA: Diagnosis not present

## 2018-06-11 LAB — COMPREHENSIVE METABOLIC PANEL
ALBUMIN: 4.3 g/dL (ref 3.5–5.0)
ALT: 15 U/L (ref 0–44)
ANION GAP: 11 (ref 5–15)
AST: 22 U/L (ref 15–41)
Alkaline Phosphatase: 59 U/L (ref 38–126)
BUN: 8 mg/dL (ref 6–20)
CO2: 22 mmol/L (ref 22–32)
Calcium: 9.4 mg/dL (ref 8.9–10.3)
Chloride: 97 mmol/L — ABNORMAL LOW (ref 98–111)
Creatinine, Ser: 0.66 mg/dL (ref 0.44–1.00)
GFR calc Af Amer: >60 mL/min (ref >60)
GFR calc non Af Amer: >60 mL/min (ref >60)
GLUCOSE: 240 mg/dL — AB (ref 70–99)
Potassium: 4 mmol/L (ref 3.5–5.1)
Sodium: 130 mmol/L — ABNORMAL LOW (ref 135–145)
Total Bilirubin: 0.8 mg/dL (ref 0.3–1.2)
Total Protein: 7.1 g/dL (ref 6.5–8.1)

## 2018-06-11 LAB — CBC
HCT: 44.3 % (ref 36.0–46.0)
HEMOGLOBIN: 15 g/dL (ref 12.0–15.0)
MCH: 31.3 pg (ref 26.0–34.0)
MCHC: 33.9 g/dL (ref 30.0–36.0)
MCV: 92.3 fL (ref 80.0–100.0)
NRBC: 0 % (ref 0.0–0.2)
Platelets: 282 10*3/uL (ref 150–400)
RBC: 4.8 MIL/uL (ref 3.87–5.11)
RDW: 11.7 % (ref 11.5–15.5)
WBC: 10.1 10*3/uL (ref 4.0–10.5)

## 2018-06-11 LAB — SALICYLATE LEVEL: Salicylate Lvl: <7 mg/dL (ref 2.8–30.0)

## 2018-06-11 LAB — RAPID URINE DRUG SCREEN, HOSP PERFORMED
Amphetamines: NOT DETECTED
Barbiturates: NOT DETECTED
Benzodiazepines: NOT DETECTED
COCAINE: NOT DETECTED
Opiates: NOT DETECTED
Tetrahydrocannabinol: NOT DETECTED

## 2018-06-11 LAB — URINALYSIS, ROUTINE W REFLEX MICROSCOPIC
Bilirubin Urine: NEGATIVE
KETONES UR: 5 mg/dL — AB
NITRITE: NEGATIVE
PH: 6 (ref 5.0–8.0)
Protein, ur: NEGATIVE mg/dL
SPECIFIC GRAVITY, URINE: 1.001 — AB (ref 1.005–1.030)

## 2018-06-11 LAB — CBG MONITORING, ED
Glucose-Capillary: 176 mg/dL — ABNORMAL HIGH (ref 70–99)
Glucose-Capillary: 269 mg/dL — ABNORMAL HIGH (ref 70–99)
Glucose-Capillary: 241 mg/dL — ABNORMAL HIGH (ref 70–99)

## 2018-06-11 LAB — GLUCOSE, CAPILLARY
GLUCOSE-CAPILLARY: 192 mg/dL — AB (ref 70–99)
GLUCOSE-CAPILLARY: 177 mg/dL — AB (ref 70–99)

## 2018-06-11 LAB — T4, FREE: FREE T4: 1.28 ng/dL (ref 0.82–1.77)

## 2018-06-11 LAB — TSH: TSH: 1.476 u[IU]/mL (ref 0.350–4.500)

## 2018-06-11 LAB — ACETAMINOPHEN LEVEL: Acetaminophen (Tylenol), Serum: <10 ug/mL — ABNORMAL LOW (ref 10–30)

## 2018-06-11 LAB — ETHANOL

## 2018-06-11 MED ORDER — PRAVASTATIN SODIUM 20 MG PO TABS
80.0000 mg | ORAL_TABLET | Freq: Every day | ORAL | Status: DC
  Administered 2018-06-11: 80 mg via ORAL
  Filled 2018-06-11: qty 4

## 2018-06-11 MED ORDER — LEVOTHYROXINE SODIUM 88 MCG PO TABS: 88.0000 ug | ORAL_TABLET | Freq: Every day | ORAL | Status: DC

## 2018-06-11 MED ORDER — ACETAMINOPHEN 325 MG PO TABS: 650.0000 mg | ORAL_TABLET | Freq: Four times a day (QID) | ORAL | Status: DC | PRN

## 2018-06-11 MED ORDER — MAGNESIUM HYDROXIDE 400 MG/5ML PO SUSP: 30.0000 mL | Freq: Every day | ORAL | Status: DC | PRN

## 2018-06-11 MED ORDER — INSULIN ASPART 100 UNIT/ML ~~LOC~~ SOLN: 0.0000 [IU] | Freq: Three times a day (TID) | SUBCUTANEOUS | Status: DC

## 2018-06-11 MED ORDER — INSULIN GLARGINE 100 UNIT/ML ~~LOC~~ SOLN
22.0000 [IU] | Freq: Every day | SUBCUTANEOUS | Status: DC
  Administered 2018-06-12 – 2018-06-17 (×6): 22 [IU] via SUBCUTANEOUS

## 2018-06-11 MED ORDER — INSULIN DEGLUDEC 100 UNIT/ML ~~LOC~~ SOPN: 22.0000 [IU] | PEN_INJECTOR | Freq: Every day | SUBCUTANEOUS | Status: DC

## 2018-06-11 MED ORDER — ALUM & MAG HYDROXIDE-SIMETH 200-200-20 MG/5ML PO SUSP
30.0000 mL | ORAL | Status: DC | PRN
  Administered 2018-06-14: 30 mL via ORAL
  Filled 2018-06-11: qty 30

## 2018-06-11 MED ORDER — SERTRALINE HCL 50 MG PO TABS
100.0000 mg | ORAL_TABLET | Freq: Every day | ORAL | Status: DC
  Administered 2018-06-11: 100 mg via ORAL
  Filled 2018-06-11: qty 2

## 2018-06-11 MED ORDER — CIPROFLOXACIN HCL 500 MG PO TABS
500.0000 mg | ORAL_TABLET | Freq: Two times a day (BID) | ORAL | Status: DC
  Administered 2018-06-11 – 2018-06-13 (×5): 500 mg via ORAL
  Filled 2018-06-11: qty 1
  Filled 2018-06-11 (×2): qty 2
  Filled 2018-06-11 (×5): qty 1

## 2018-06-11 MED ORDER — LOSARTAN POTASSIUM 50 MG PO TABS
100.0000 mg | ORAL_TABLET | Freq: Every day | ORAL | Status: DC
  Administered 2018-06-11: 100 mg via ORAL
  Filled 2018-06-11: qty 2

## 2018-06-11 MED ORDER — PRAVASTATIN SODIUM 40 MG PO TABS
80.0000 mg | ORAL_TABLET | Freq: Every day | ORAL | Status: DC
  Administered 2018-06-12 – 2018-06-17 (×6): 80 mg via ORAL
  Filled 2018-06-11: qty 1
  Filled 2018-06-11 (×2): qty 2
  Filled 2018-06-11 (×2): qty 1
  Filled 2018-06-11 (×2): qty 2
  Filled 2018-06-11: qty 1
  Filled 2018-06-11: qty 2
  Filled 2018-06-11: qty 1

## 2018-06-11 MED ORDER — LEVOTHYROXINE SODIUM 88 MCG PO TABS
88.0000 ug | ORAL_TABLET | Freq: Every day | ORAL | Status: DC
  Administered 2018-06-12 – 2018-06-17 (×6): 88 ug via ORAL
  Filled 2018-06-11 (×9): qty 1

## 2018-06-11 MED ORDER — INSULIN GLARGINE 100 UNIT/ML ~~LOC~~ SOLN
22.0000 [IU] | Freq: Every day | SUBCUTANEOUS | Status: DC
  Administered 2018-06-11: 22 [IU] via SUBCUTANEOUS
  Filled 2018-06-11: qty 0.22

## 2018-06-11 MED ORDER — CIPROFLOXACIN HCL 500 MG PO TABS: 500.0000 mg | ORAL_TABLET | Freq: Two times a day (BID) | ORAL | Status: DC

## 2018-06-11 MED ORDER — SERTRALINE HCL 100 MG PO TABS
100.0000 mg | ORAL_TABLET | Freq: Every day | ORAL | Status: DC
  Administered 2018-06-12 – 2018-06-13 (×2): 100 mg via ORAL
  Filled 2018-06-11 (×2): qty 1
  Filled 2018-06-11: qty 2
  Filled 2018-06-11 (×3): qty 1

## 2018-06-11 MED ORDER — LOSARTAN POTASSIUM 50 MG PO TABS
100.0000 mg | ORAL_TABLET | Freq: Every day | ORAL | Status: DC
  Administered 2018-06-12 – 2018-06-17 (×6): 100 mg via ORAL
  Filled 2018-06-11 (×10): qty 2

## 2018-06-11 MED ORDER — INSULIN ASPART 100 UNIT/ML ~~LOC~~ SOLN
0.0000 [IU] | Freq: Three times a day (TID) | SUBCUTANEOUS | Status: DC
  Administered 2018-06-11: 5 [IU] via SUBCUTANEOUS
  Administered 2018-06-11: 3 [IU] via SUBCUTANEOUS

## 2018-06-11 MED ORDER — TRAZODONE HCL 50 MG PO TABS
50.0000 mg | ORAL_TABLET | Freq: Every evening | ORAL | Status: DC | PRN
  Administered 2018-06-11 – 2018-06-16 (×4): 50 mg via ORAL
  Filled 2018-06-11 (×4): qty 1

## 2018-06-11 MED ORDER — HYDROXYZINE HCL 25 MG PO TABS
25.0000 mg | ORAL_TABLET | Freq: Three times a day (TID) | ORAL | Status: DC | PRN
  Filled 2018-06-11: qty 1

## 2018-06-11 NOTE — ED Notes (Signed)
Pt arrived to room 27 in purple scrubs and did not have any belongings besides glasses that she was wearing. Pt given urine cup to obtain sample. Security advised that pt husband had taken belongings home.

## 2018-06-11 NOTE — ED Notes (Signed)
[facility] called for transport. 

## 2018-06-11 NOTE — BH Assessment (Signed)
Assessment Note  Barbara Forbes is an 57 y.o. female.  -Clinician reviewed note by Dr. Florina Ou.  Barbara Forbes is a 57 y.o. female states she is here because her husband made her come.  When asked why he better, she states she does not wish to talk about it.  Her husband had reported to nursing staff that the patient has had "odd behavior" for the past 2 days.  He states she has been paranoid and felt like people are following her and out to get her.  She is also been threatening suicide, specifically threatening to drive into a bridge.  Patient will not speak to this clinician.  When asked why she was here she said that "my husband made me come."  She said 2-3 times that "I don't want to answer questions right now."  Clinician explained that patient would need to talk to the psychiatric team that comes in during the morning.  Patient said "It has been a long night I don't feel like talking."  Patient has no previous assessments on record.    Diagnosis: MDD single episode severe  Past Medical History:  Past Medical History:  Diagnosis Date  . Arthritis   . Diabetes mellitus    since age 52  . Diabetic neuropathy ([place])   . Fatigue   . Head ache   . Hyperlipidemia   . Hypertension     Past Surgical History:  Procedure Laterality Date  . ABDOMINAL HYSTERECTOMY  2004  . CESAREAN SECTION  1989    Family History:  Family History  Problem Relation Age of Onset  . Stroke Mother   . Lupus Mother   . Cancer Father        Lung/brain  . Coronary artery disease Father     Social History:  reports that she has never smoked. She has never used smokeless tobacco. She reports that she does not drink alcohol or use drugs.  Additional Social History:  Alcohol / Drug Use Pain Medications: See PTA medication  Prescriptions: See PTA medication list Over the Counter: See PTA medication list History of alcohol / drug use?: No history of alcohol / drug abuse  CIWA: CIWA-Ar BP: (!)  143/83 Pulse Rate: (!) 101 COWS:    Allergies:  Allergies  Allergen Reactions  . Penicillins Rash    Has patient had a PCN reaction causing immediate rash, facial/tongue/throat swelling, SOB or lightheadedness with hypotension: Yes Has patient had a PCN reaction causing severe rash involving mucus membranes or skin necrosis: No Has patient had a PCN reaction that required hospitalization: Unknown Has patient had a PCN reaction occurring within the last 10 years: No If all of the above answers are "NO", then may proceed with Cephalosporin use.   . Sulfa Antibiotics Rash  . Sulfamethoxazole Rash    Home Medications:  (Not in a hospital admission)  OB/GYN Status:  No LMP recorded. Patient has had a hysterectomy.  General Assessment Data Location of Assessment: [facility] ED TTS Assessment: In system Is this a Tele or Face-to-Face Assessment?: Face-to-Face Is this an Initial Assessment or a Re-assessment for this encounter?: Initial Assessment Patient Accompanied by:: Other(Husband had brought her in.) Language Other than English: No Living Arrangements: Other (Comment)(Pt living w/ husband.) What gender do you identify as?: Female Marital status: Married [place] name: Won't tell. Pregnancy Status: No Living Arrangements: Spouse/significant other Can pt return to current living arrangement?: Yes Admission Status: Voluntary Is patient capable of signing voluntary admission?: Yes Referral  Source: Self/Family/Friend(Husband brought her in.) Insurance type: BC/BS     Crisis Care Plan Living Arrangements: Spouse/significant other Name of Psychiatrist: Unknown Name of Therapist: Unknown  Education Status Is patient currently in school?: No Is the patient employed, unemployed or receiving disability?: Unemployed  Risk to self with the past 6 months Suicidal Ideation: Yes-Currently Present Has patient been a risk to self within the past 6 months prior to admission? : Other  (comment) Suicidal Intent: No Has patient had any suicidal intent within the past 6 months prior to admission? : Other (comment) Is patient at risk for suicide?: (Unknown) Suicidal Plan?: Yes-Currently Present Has patient had any suicidal plan within the past 6 months prior to admission? : No Specify Current Suicidal Plan: Made statement about driving off a bridge Access to Means: Yes Specify Access to Suicidal Means: Vehicle presumably What has been your use of drugs/alcohol within the last 12 months?: Denies Previous Attempts/Gestures: No How many times?: (Unknown) Other Self Harm Risks: Unknown Triggers for Past Attempts: None known Intentional Self Injurious Behavior: None Family Suicide History: Unknown Recent stressful life event(s): Other (Comment)([place]) Persecutory voices/beliefs?: Yes Depression: Yes Depression Symptoms: Despondent, Feeling angry/irritable, Loss of interest in usual pleasures Substance abuse history and/or treatment for substance abuse?: No Suicide prevention information given to non-admitted patients: Not applicable  Risk to Others within the past 6 months Homicidal Ideation: No Does patient have any lifetime risk of violence toward others beyond the six months prior to admission? : Unknown Thoughts of Harm to Others: No Current Homicidal Intent: No Current Homicidal Plan: No Access to Homicidal Means: No Identified Victim: [place] History of harm to others?: No Assessment of Violence: None Noted Violent Behavior Description: Unknown really Does patient have access to weapons?: No Criminal Charges Pending?: No Does patient have a court date: No Is patient on probation?: Unknown  Psychosis Hallucinations: None noted Delusions: None noted  Mental Status Report Appearance/Hygiene: Disheveled Eye Contact: Poor Motor Activity: Unremarkable Speech: Logical/coherent, Soft Level of Consciousness: Quiet/awake, Irritable Mood: Anxious, Suspicious,  Euthymic Affect: Anxious, Appropriate to circumstance, Irritable Anxiety Level: Severe Thought Processes: Unable to Assess Judgement: Unable to Assess Orientation: Unable to assess Obsessive Compulsive Thoughts/Behaviors: Unable to Assess  Cognitive Functioning Memory: Unable to Assess Is patient IDD: No Insight: Poor Impulse Control: Unable to Assess Appetite: (UTA) Have you had any weight changes? : No Change Sleep: Unable to Assess Total Hours of Sleep: (UTA) Vegetative Symptoms: Unable to Assess  ADLScreening [facility] Assessment Services) Patient's cognitive ability adequate to safely complete daily activities?: Yes Patient able to express need for assistance with ADLs?: Yes Independently performs ADLs?: Yes (appropriate for developmental age)  Prior Inpatient Therapy Prior Inpatient Therapy: (Unknown)  Prior Outpatient Therapy Prior Outpatient Therapy: (Unknown)  ADL Screening (condition at time of admission) Patient's cognitive ability adequate to safely complete daily activities?: Yes Is the patient deaf or have difficulty hearing?: No Does the patient have difficulty seeing, even when wearing glasses/contacts?: No Does the patient have difficulty concentrating, remembering, or making decisions?: Yes Patient able to express need for assistance with ADLs?: Yes Does the patient have difficulty dressing or bathing?: No Independently performs ADLs?: Yes (appropriate for developmental age) Does the patient have difficulty walking or climbing stairs?: No Weakness of Legs: None Weakness of Arms/Hands: None       Abuse/Neglect Assessment (Assessment to be complete while patient is alone) Abuse/Neglect Assessment Can Be Completed: Yes Physical Abuse: Denies Verbal Abuse: Denies Sexual Abuse: Denies Exploitation of patient/patient's resources:  Denies Self-Neglect: Denies     Regulatory affairs officer (For Healthcare) Does Patient Have a Medical Advance Directive?: No Would  patient like information on creating a medical advance directive?: No - Patient declined          Disposition:  Disposition Initial Assessment Completed for this Encounter: Yes Patient referred to: (AM psych eval)  On Site Evaluation by:   Reviewed with Physician:    Raymondo Band 06/11/2018 3:46 AM

## 2018-06-11 NOTE — ED Notes (Signed)
[facility] with TTS at bedside to assess pt.

## 2018-06-11 NOTE — Consult Note (Signed)
[facility] Face-to-Face Psychiatry Consult   Reason for Consult: Odd behaviors    Referring Physician:  Dr. Molpus  Patient Identification: Barbara Forbes MRN:  [id number] Principal Diagnosis: MDD (major depressive disorder), recurrent, severe, with psychosis (HCC) Diagnosis:   Patient Active Problem List   Diagnosis Date Noted  . MDD (major depressive disorder), recurrent, severe, with psychosis (HCC) [F33.3] 06/11/2018  . 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 Thyroiditis, lymphocytic [E06.3] 10/30/2013  . Chest pain [R07.9] 01/23/2012  . Palpitations [R00.2] 01/23/2012    Total Time spent with patient: 30 minutes  Subjective:   Barbara Forbes is a 57 y.o. female patient admitted with odd behavior, paranoia, and suicidal thoughts.    HPI:  57 year old female who presents with her husband after exhibiting odd behaviors for the past 2 days. She reports paranoia that she describes as people talking about her. She s alert and oriented, however not forth coming with information. She is able to answer questions appropriately and collateral obtained from her husband validates these facts. SHe reports fleeting suicidal thoughts. She has a history of major depression, and has been on zoloft 100mg po daily. She denies any history of medication   Past Psychiatric History: MDD   Risk to Self: Suicidal Ideation: Yes-Currently Present Suicidal Intent: No Is patient at risk for suicide?: (Unknown) Suicidal Plan?: Yes-Currently Present Specify Current Suicidal Plan: Made statement about driving off a bridge Access to Means: Yes Specify Access to Suicidal Means: Vehicle presumably What has been your use of drugs/alcohol within the last 12 months?: Denies How many times?: (Unknown) Other Self Harm Risks: Unknown Triggers for Past Attempts: None known Intentional Self Injurious Behavior: None Risk to Others: Homicidal Ideation: No Thoughts of Harm to Others:  No Current Homicidal Intent: No Current Homicidal Plan: No Access to Homicidal Means: No Identified Victim: [place] History of harm to others?: No Assessment of Violence: None Noted Violent Behavior Description: Unknown really Does patient have access to weapons?: No Criminal Charges Pending?: No Does patient have a court date: No Prior Inpatient Therapy: Prior Inpatient Therapy: (Unknown) Prior Outpatient Therapy: Prior Outpatient Therapy: (Unknown)  Past Medical History:  Past Medical History:  Diagnosis Date  . Arthritis   . Diabetes mellitus    since age 6  . Diabetic neuropathy (HCC)   . Fatigue   . Head ache   . Hyperlipidemia   . Hypertension     Past Surgical History:  Procedure Laterality Date  . ABDOMINAL HYSTERECTOMY  2004  . CESAREAN SECTION  1989   Family History:  Family History  Problem Relation Age of Onset  . Stroke Mother   . Lupus Mother   . Cancer Father        Lung/brain  . Coronary artery disease Father    Family Psychiatric  History: Mother -hx of vascular dementia (entered nursing home 3 years ago at the age of 82).  Social History:  Social History   Substance and Sexual Activity  Alcohol Use No     Social History   Substance and Sexual Activity  Drug Use No    Social History   Socioeconomic History  . Marital status: Married    Spouse name: Not on file  . Number of children: 1  . Years of education: Not on file  . Highest education level: Not on file  Occupational History  . Occupation: English Instructor/GED instructor    Employer: [employer]  Social Needs  .   Financial resource strain: Not on file  . Food insecurity:    Worry: Not on file    Inability: Not on file  . Transportation needs:    Medical: Not on file    Non-medical: Not on file  Tobacco Use  . Smoking status: Never Smoker  . Smokeless tobacco: Never Used  Substance and Sexual Activity  . Alcohol use: No  . Drug use: No  . Sexual  activity: Not on file  Lifestyle  . Physical activity:    Days per week: Not on file    Minutes per session: Not on file  . Stress: Not on file  Relationships  . Social connections:    Talks on phone: Not on file    Gets together: Not on file    Attends religious service: Not on file    Active member of club or organization: Not on file    Attends meetings of clubs or organizations: Not on file    Relationship status: Not on file  Other Topics Concern  . Not on file  Social History Narrative  . Not on file   Additional Social History:    Allergies:   Allergies  Allergen Reactions  . Penicillins Rash    Has patient had a PCN reaction causing immediate rash, facial/tongue/throat swelling, SOB or lightheadedness with hypotension: Yes Has patient had a PCN reaction causing severe rash involving mucus membranes or skin necrosis: No Has patient had a PCN reaction that required hospitalization: Unknown Has patient had a PCN reaction occurring within the last 10 years: No If all of the above answers are "NO", then may proceed with Cephalosporin use.   . Sulfa Antibiotics Rash  . Sulfamethoxazole Rash    Labs:  Results for orders placed or performed during the hospital encounter of 06/10/18 (from the past 48 hour(s))  Urinalysis, Routine w reflex microscopic     Status: Abnormal   Collection Time: 06/11/18 12:53 AM  Result Value Ref Range   Color, Urine STRAW (A) YELLOW   APPearance CLEAR CLEAR   Specific Gravity, Urine 1.001 (L) 1.005 - 1.030   pH 6.0 5.0 - 8.0   Glucose, UA >=500 (A) NEGATIVE mg/dL   Hgb urine dipstick SMALL (A) NEGATIVE   Bilirubin Urine NEGATIVE NEGATIVE   Ketones, ur 5 (A) NEGATIVE mg/dL   Protein, ur NEGATIVE NEGATIVE mg/dL   Nitrite NEGATIVE NEGATIVE   Leukocytes, UA SMALL (A) NEGATIVE   RBC / HPF 0-5 0 - 5 RBC/hpf   WBC, UA 6-10 0 - 5 WBC/hpf   Bacteria, UA RARE (A) NONE SEEN   Squamous Epithelial / LPF 6-10 0 - 5   Mucus PRESENT     Comment:  Performed at [facility], [address] [place], [place] [address]  Comprehensive metabolic panel     Status: Abnormal   Collection Time: 06/11/18 12:54 AM  Result Value Ref Range   Sodium 130 (L) 135 - 145 mmol/L   Potassium 4.0 3.5 - 5.1 mmol/L   Chloride 97 (L) 98 - 111 mmol/L   CO2 22 22 - 32 mmol/L   Glucose, Bld 240 (H) 70 - 99 mg/dL   BUN 8 6 - 20 mg/dL   Creatinine, Ser 0.66 0.44 - 1.00 mg/dL   Calcium 9.4 8.9 - 10.3 mg/dL   Total Protein 7.1 6.5 - 8.1 g/dL   Albumin 4.3 3.5 - 5.0 g/dL   AST 22 15 - 41 U/L   ALT 15 0 -   44 U/L   Alkaline Phosphatase 59 38 - 126 U/L   Total Bilirubin 0.8 0.3 - 1.2 mg/dL   GFR calc non Af Amer >60 >60 mL/min   GFR calc Af Amer >60 >60 mL/min    Comment: (NOTE) The eGFR has been calculated using the CKD EPI equation. This calculation has not been validated in all clinical situations. eGFR's persistently <60 mL/min signify possible Chronic Kidney Disease.    Anion gap 11 5 - 15    Comment: Performed at [facility], [address] [place], [place] [address]  Ethanol     Status: None   Collection Time: 06/11/18 12:54 AM  Result Value Ref Range   Alcohol, Ethyl (B) <10 <10 mg/dL    Comment: (NOTE) Lowest detectable limit for serum alcohol is 10 mg/dL. For medical purposes only. Performed at [facility], [address] [place], [place] [address]   Salicylate level     Status: None   Collection Time: 06/11/18 12:54 AM  Result Value Ref Range   Salicylate Lvl <7.0 2.8 - 30.0 mg/dL    Comment: Performed at [facility], [address] [place], [place] [address]  Acetaminophen level     Status: Abnormal   Collection Time: 06/11/18 12:54 AM  Result Value Ref Range   Acetaminophen (Tylenol), Serum <10 (L) 10 - 30 ug/mL    Comment: (NOTE) Therapeutic concentrations vary significantly. A range of 10-30 ug/mL  may be an effective concentration for many patients.  However, some  are best treated at concentrations outside of this range. Acetaminophen concentrations >150 ug/mL at 4 hours after ingestion  and >50 ug/mL at 12 hours after ingestion are often associated with  toxic reactions. Performed at [facility], [address] [place], [place] [address]   cbc     Status: None   Collection Time: 06/11/18 12:54 AM  Result Value Ref Range   WBC 10.1 4.0 - 10.5 K/uL   RBC 4.80 3.87 - 5.11 MIL/uL   Hemoglobin 15.0 12.0 - 15.0 g/dL   HCT 44.3 36.0 - 46.0 %   MCV 92.3 80.0 - 100.0 fL   MCH 31.3 26.0 - 34.0 pg   MCHC 33.9 30.0 - 36.0 g/dL   RDW 11.7 11.5 - 15.5 %   Platelets 282 150 - 400 K/uL   nRBC 0.0 0.0 - 0.2 %    Comment: Performed at [facility], [address] , [place] [address]  Rapid urine drug screen (hospital performed)     Status: None   Collection Time: 06/11/18 12:54 AM  Result Value Ref Range   Opiates NONE DETECTED NONE DETECTED   Cocaine NONE DETECTED NONE DETECTED   Benzodiazepines NONE DETECTED NONE DETECTED   Amphetamines NONE DETECTED NONE DETECTED   Tetrahydrocannabinol NONE DETECTED NONE DETECTED   Barbiturates NONE DETECTED NONE DETECTED    Comment: (NOTE) DRUG SCREEN FOR MEDICAL PURPOSES ONLY.  IF CONFIRMATION IS NEEDED FOR ANY PURPOSE, NOTIFY LAB WITHIN 5 DAYS. LOWEST DETECTABLE LIMITS FOR URINE DRUG SCREEN Drug Class                     Cutoff (ng/mL) Amphetamine and metabolites    1000 Barbiturate and metabolites    200 Benzodiazepine                 200 Tricyclics and metabolites     300 Opiates and metabolites          300 Cocaine and metabolites        300 THC                            50 Performed at [facility], [place] [address], [place], [place] [address]   CBG monitoring, ED     Status: Abnormal   Collection Time: 06/11/18  8:11 AM  Result Value Ref Range   Glucose-Capillary 176 (H) 70 - 99 mg/dL   Comment 1 Notify RN    Comment  2 Document in Chart   TSH     Status: None   Collection Time: 06/11/18 10:25 AM  Result Value Ref Range   TSH 1.476 0.350 - 4.500 uIU/mL    Comment: Performed by a 3rd Generation assay with a functional sensitivity of <=0.01 uIU/mL. Performed at [facility], [place] [address] [place], [place] [address]   CBG monitoring, ED     Status: Abnormal   Collection Time: 06/11/18 11:27 AM  Result Value Ref Range   Glucose-Capillary 269 (H) 70 - 99 mg/dL  CBG monitoring, ED     Status: Abnormal   Collection Time: 06/11/18  1:11 PM  Result Value Ref Range   Glucose-Capillary 241 (H) 70 - 99 mg/dL    Current Facility-Administered Medications  Medication Dose Route Frequency Provider Last Rate Last Dose  . insulin aspart (novoLOG) injection 0-15 Units  0-15 Units Subcutaneous TID WC Duffy Bruce, MD   3 Units at 06/11/18 [id number]  . insulin glargine (LANTUS) injection 22 Units  22 Units Subcutaneous Daily Duffy Bruce, MD   22 Units at 06/11/18 0859  . [START ON 06/12/2018] levothyroxine (SYNTHROID, LEVOTHROID) tablet 88 mcg  88 mcg Oral Q0600 Duffy Bruce, MD      . losartan (COZAAR) tablet 100 mg  100 mg Oral Daily Duffy Bruce, MD   100 mg at 06/11/18 0902  . pravastatin (PRAVACHOL) tablet 80 mg  80 mg Oral Daily Duffy Bruce, MD   80 mg at 06/11/18 1122  . sertraline (ZOLOFT) tablet 100 mg  100 mg Oral Daily Duffy Bruce, MD   100 mg at 06/11/18 1123   Current Outpatient Medications  Medication Sig Dispense Refill  . insulin aspart (NOVOLOG) 100 UNIT/ML injection Inject 0-15 Units into the skin 3 (three) times daily before meals. Per sliding scale    . insulin degludec (TRESIBA FLEXTOUCH) 100 UNIT/ML SOPN FlexTouch Pen Inject 22 Units into the skin daily.    Marland Kitchen levothyroxine (SYNTHROID, LEVOTHROID) 88 MCG tablet Take 88 mcg by mouth daily before breakfast.    . losartan (COZAAR) 100 MG tablet Take 100 mg by mouth daily.    . pravastatin (PRAVACHOL) 80 MG tablet  Take 80 mg by mouth daily.    . sertraline (ZOLOFT) 100 MG tablet Take 100 mg by mouth daily.  5    Musculoskeletal: Strength & Muscle Tone: within normal limits Gait & Station: normal Patient leans: N/A  Psychiatric Specialty Exam: Physical Exam  ROS  Blood pressure (!) 149/70, pulse (!) 102, temperature 98.6 F (37 C), temperature source Oral, resp. rate 15, SpO2 95 %.There is no height or weight on file to calculate BMI.  General Appearance: Fairly Groomed  Eye Contact:  Fair  Speech:  Clear and Coherent and Normal Rate  Volume:  Normal  Mood:  Depressed  Affect:  Depressed and Flat  Thought Process:  Coherent, Linear and Descriptions of Associations: Intact  Orientation:  Full (Time, Place, and Person)  Thought Content:  Logical  Suicidal Thoughts:  passive suicidal thoughts  Homicidal Thoughts:  No  Memory:  Immediate;   Fair Recent;   Good Remote;   Good  Judgement:  Intact  Insight:  Present  Psychomotor Activity:  Normal  Concentration:  Concentration: Fair and Attention Span: Good  Recall:  [employer] of Knowledge:  Fair  Language:  Fair  Akathisia:  No  Handed:  Right  AIMS (if indicated):     Assets:  Communication Skills Desire for Improvement Financial Resources/Insurance Leisure Time Transportation Vocational/Educational  ADL's:  Intact  Cognition:  WNL  Sleep:        Treatment Plan Summary: Daily contact with patient to assess and evaluate symptoms and progress in treatment and Medication management  Obtained CT scan of the head- no acute processes or infarct, CXR - noormal, TSH- normal. UA - + leukocytes and bacteria ( started on Cipro 574m po BID). WIll admit to 400 hall.  Disposition: Recommend psychiatric Inpatient admission when medically cleared.  TSuella Broad FNP 06/11/2018 1:24 PM

## 2018-06-11 NOTE — Tx Team (Signed)
Initial Treatment Plan 06/11/2018 7:07 PM Barbara Forbes IWP:[id number]    PATIENT STRESSORS: Other: Pt's belief that she is being investigated for theft (not substantiated)   PATIENT STRENGTHS: Communication skills Supportive family/friends   PATIENT IDENTIFIED PROBLEMS: depression  Suicidal ideation "My future is over"  anxiety                 DISCHARGE CRITERIA:  Improved stabilization in mood, thinking, and/or behavior Need for constant or close observation no longer present Reduction of life-threatening or endangering symptoms to within safe limits Verbal commitment to aftercare and medication compliance  PRELIMINARY DISCHARGE PLAN: Outpatient therapy Participate in family therapy Return to previous living arrangement Return to previous work or school arrangements  PATIENT/FAMILY INVOLVEMENT: This treatment plan has been presented to and reviewed with the patient, Barbara Forbes, and/or family member.  The patient and family have been given the opportunity to ask questions and make suggestions.  Judie Petit, RN 06/11/2018, 7:07 PM

## 2018-06-11 NOTE — ED Notes (Signed)
Off floor for chest xray

## 2018-06-11 NOTE — ED Notes (Signed)
Phlebotomist called for ordered TSH

## 2018-06-11 NOTE — ED Notes (Signed)
Report called to Demaris Callander at behavioral health.

## 2018-06-11 NOTE — ED Notes (Signed)
Verbal order from Dr.Molpus for TTS consult

## 2018-06-11 NOTE — Progress Notes (Signed)
D:  Barbara Forbes was in her room much of the evening.  She denied SI/HI or A/V hallucinations.  She was very guarded and fearful but pleasant.  She does report that she hasn't been sleeping and is worried about people being in her room to get her.  She kept saying "I deserve it though" after everything that wasn't going right this evening.  She was hesitant with medications but did agree to take trazodone for sleep.  "I don't want something that is going to make me suicidal or more paranoid."  HS blood sugar was 177.  She did come up to the day room for snack. A:  1:1 with RN for support and encouragement.  Medications as ordered.  Q 15 minute checks maintained for safety.  Encouraged participation in group and unit activities.   R:  Barbara Forbes remains safe on the unit.  We will continue to monitor the progress towards her goals.

## 2018-06-11 NOTE — Progress Notes (Signed)
Pt admitted voluntarily to [facility] d/t suicidal ideation.  Pt reported she was having thoughts of driving into a tree or a bridge.  Pt stated though she wished she was dead she is unable of completing suicide.  Pt stated that she feels her future is over because she is being investigated d/t her past which includes theft.  She stated she believes that this writer is part of the team completing the investigation.  Pt denies AVH and suicidal ideation at the time of admission, restating I know I just can't do it.  Pt lives with husband and children and stated this is her first admission to this facility.  Pt is diabetic and states her blood sugars can be "brittle" ranging as high as the 700s.  Fifteen minute checks initiated for patient safety.  Pt oriented to unit.  Pt safe on unit.

## 2018-06-11 NOTE — ED Notes (Signed)
Attempt blood draw x1. Once needle was inserted the pt stated to "take it out, this isn't working." Pt stated that she could not deal with the blood draw. Giving pt a few minutes before attempting again.

## 2018-06-11 NOTE — ED Notes (Signed)
Pt declines to disclose why is she here, sts "I don't want to talk about it". When asked if she feels suicidal she sts "I was yesterday but not today". Pt however tears up when talking about her diabetes medications saying "it's okay if you don't want me to take it, I know you want me to die. Everyone wants me to, and I am ready, I will die". Explained to pt that doctor will order her medications and as soon as pharmacy release the orders they will be administered as ordered, same as she takes them at home. Pt verbalized understanding.

## 2018-06-11 NOTE — ED Provider Notes (Signed)
[facility] DEPT Provider Note: Georgena Spurling, MD, FACEP  CSN: [id number] MRN: [id number] ARRIVAL: 06/10/18 at 2206 ROOM: WA27/WA27   CHIEF COMPLAINT  Suicidal  Level 5 caveat: Uncooperative HISTORY OF PRESENT ILLNESS  06/11/18 3:24 AM Barbara Forbes is a 57 y.o. female states she is here because her husband made her come.  When asked why he better, she states she does not wish to talk about it.  Her husband had reported to nursing staff that the patient has had "odd behavior" for the past 2 days.  He states she has been paranoid and felt like people are following her and out to get her.  She is also been threatening suicide, specifically threatening to drive into a bridge.  She denies pain.  Who was reportedly started on Zoloft 2 weeks ago.   Past Medical History:  Diagnosis Date  . Arthritis   . Diabetes mellitus    since age 44  . Diabetic neuropathy ([place])   . Fatigue   . Head ache   . Hyperlipidemia   . Hypertension     Past Surgical History:  Procedure Laterality Date  . ABDOMINAL HYSTERECTOMY  2004  . CESAREAN SECTION  1989    Family History  Problem Relation Age of Onset  . Stroke Mother   . Lupus Mother   . Cancer Father        Lung/brain  . Coronary artery disease Father     Social History   Tobacco Use  . Smoking status: Never Smoker  . Smokeless tobacco: Never Used  Substance Use Topics  . Alcohol use: No  . Drug use: No    Prior to Admission medications   Medication Sig Start Date End Date Taking? Authorizing Provider  insulin aspart (NOVOLOG) 100 UNIT/ML injection Inject 0-15 Units into the skin 3 (three) times daily before meals. Per sliding scale   Yes [provider]  insulin degludec (TRESIBA FLEXTOUCH) 100 UNIT/ML SOPN FlexTouch Pen Inject 22 Units into the skin daily.   Yes [provider]  levothyroxine (SYNTHROID, LEVOTHROID) 88 MCG tablet Take 88 mcg by mouth daily before breakfast.   Yes [provider]  losartan (COZAAR) 100 MG tablet Take 100 mg by mouth daily.   Yes [provider]  pravastatin (PRAVACHOL) 80 MG tablet Take 80 mg by mouth daily.   Yes [provider]  sertraline (ZOLOFT) 100 MG tablet Take 100 mg by mouth daily. 05/22/18  Yes [provider]    Allergies Penicillins; Sulfa antibiotics; and Sulfamethoxazole   REVIEW OF SYSTEMS     PHYSICAL EXAMINATION  Initial Vital Signs Blood pressure (!) 143/83, pulse (!) 101, temperature 98.6 F (37 C), SpO2 95 %.  Examination General: Well-developed, well-nourished female in no acute distress; appearance consistent with age of record HENT: normocephalic; atraumatic Eyes: pupils equal, round and reactive to light; extraocular muscles intact Neck: supple Heart: regular rate and rhythm Lungs: clear to auscultation bilaterally Abdomen: soft; nondistended; nontender; bowel sounds present Extremities: No deformity; full range of motion; pulses normal Neurologic: Awake, alert; motor function intact in all extremities and symmetric; no facial droop Skin: Warm and dry Psychiatric: Flat affect; noncommunicative with   RESULTS  Summary of this visit's results, reviewed by myself:   EKG Interpretation  Date/Time:    Ventricular Rate:    PR Interval:    QRS Duration:   QT Interval:    QTC Calculation:   R Axis:     Text Interpretation:  Laboratory Studies: Results for orders placed or performed during the hospital encounter of 06/10/18 (from the past 24 hour(s))  Comprehensive metabolic panel     Status: Abnormal   Collection Time: 06/11/18 12:54 AM  Result Value Ref Range   Sodium 130 (L) 135 - 145 mmol/L   Potassium 4.0 3.5 - 5.1 mmol/L   Chloride 97 (L) 98 - 111 mmol/L   CO2 22 22 - 32 mmol/L   Glucose, Bld 240 (H) 70 - 99 mg/dL   BUN 8 6 - 20 mg/dL   Creatinine, Ser 0.66 0.44 - 1.00 mg/dL   Calcium 9.4 8.9 - 10.3 mg/dL   Total Protein 7.1 6.5 - 8.1 g/dL   Albumin 4.3  3.5 - 5.0 g/dL   AST 22 15 - 41 U/L   ALT 15 0 - 44 U/L   Alkaline Phosphatase 59 38 - 126 U/L   Total Bilirubin 0.8 0.3 - 1.2 mg/dL   GFR calc non Af Amer >60 >60 mL/min   GFR calc Af Amer >60 >60 mL/min   Anion gap 11 5 - 15  Ethanol     Status: None   Collection Time: 06/11/18 12:54 AM  Result Value Ref Range   Alcohol, Ethyl (B) <94 <70 mg/dL  Salicylate level     Status: None   Collection Time: 06/11/18 12:54 AM  Result Value Ref Range   Salicylate Lvl <9.6 2.8 - 30.0 mg/dL  Acetaminophen level     Status: Abnormal   Collection Time: 06/11/18 12:54 AM  Result Value Ref Range   Acetaminophen (Tylenol), Serum <10 (L) 10 - 30 ug/mL  cbc     Status: None   Collection Time: 06/11/18 12:54 AM  Result Value Ref Range   WBC 10.1 4.0 - 10.5 K/uL   RBC 4.80 3.87 - 5.11 MIL/uL   Hemoglobin 15.0 12.0 - 15.0 g/dL   HCT 44.3 36.0 - 46.0 %   MCV 92.3 80.0 - 100.0 fL   MCH 31.3 26.0 - 34.0 pg   MCHC 33.9 30.0 - 36.0 g/dL   RDW 11.7 11.5 - 15.5 %   Platelets 282 150 - 400 K/uL   nRBC 0.0 0.0 - 0.2 %  Rapid urine drug screen (hospital performed)     Status: None   Collection Time: 06/11/18 12:54 AM  Result Value Ref Range   Opiates NONE DETECTED NONE DETECTED   Cocaine NONE DETECTED NONE DETECTED   Benzodiazepines NONE DETECTED NONE DETECTED   Amphetamines NONE DETECTED NONE DETECTED   Tetrahydrocannabinol NONE DETECTED NONE DETECTED   Barbiturates NONE DETECTED NONE DETECTED   Imaging Studies: No results found.  ED COURSE and MDM  Nursing notes and initial vitals signs, including pulse oximetry, reviewed.  Vitals:   06/10/18 2304  BP: (!) 143/83  Pulse: (!) 101  Temp: 98.6 F (37 C)  SpO2: 95%    PROCEDURES    ED DIAGNOSES     ICD-10-CM   1. Suicidal ideation R45.851   2. Delusional disorder [facility]) F22        Shawndell Schillaci, MD 06/11/18 [phone]

## 2018-06-11 NOTE — ED Notes (Signed)
Patient left alert and oriented with [facility] transfer.

## 2018-06-11 NOTE — BH Assessment (Signed)
[facility] Assessment Progress Note  Per Hampton Abbot, MD, this pt requires psychiatric hospitalization at this time.  Leonia Reader, RN, [facility] has [facility] pt to [facility] Rm [id number]; [facility] will be ready to receive pt at 17:00.  Pt has signed Voluntary Admission and Consent for Treatment, as well as Consent to Release Information to pt's PCP, pt's endocrinologist, and to Lynder Parents, MD, and a notification call has been placed to Dr Clovis Pu.  Signed forms have been faxed to [facility].  Pt's nurse, Caren Griffins, has been notified, and agrees to send original paperwork along with pt via [facility], and to call report to [phone].  Jalene Mullet, [place] Coordinator [phone]

## 2018-06-11 NOTE — ED Notes (Signed)
Provider at bedside

## 2018-06-11 NOTE — ED Notes (Signed)
Husband at bedside visiting with pt, wanting to speak to a provider. Signed  consent to release information to husband on chart. This nurse notified psychiatry team.

## 2018-06-11 NOTE — ED Notes (Signed)
Specimen cup provided. Pt encouraged to provide urine per MD order. Verbalizes understanding.

## 2018-06-12 DIAGNOSIS — F333 Major depressive disorder, recurrent, severe with psychotic symptoms: Principal | ICD-10-CM

## 2018-06-12 DIAGNOSIS — G4700 Insomnia, unspecified: Secondary | ICD-10-CM

## 2018-06-12 DIAGNOSIS — F419 Anxiety disorder, unspecified: Secondary | ICD-10-CM

## 2018-06-12 DIAGNOSIS — R45851 Suicidal ideations: Secondary | ICD-10-CM

## 2018-06-12 LAB — GLUCOSE, CAPILLARY
GLUCOSE-CAPILLARY: 234 mg/dL — AB (ref 70–99)
Glucose-Capillary: 166 mg/dL — ABNORMAL HIGH (ref 70–99)
Glucose-Capillary: 216 mg/dL — ABNORMAL HIGH (ref 70–99)
Glucose-Capillary: 247 mg/dL — ABNORMAL HIGH (ref 70–99)

## 2018-06-12 MED ORDER — CLONAZEPAM 0.5 MG PO TABS
0.2500 mg | ORAL_TABLET | Freq: Three times a day (TID) | ORAL | Status: DC
  Filled 2018-06-12: qty 1

## 2018-06-12 MED ORDER — ARIPIPRAZOLE 5 MG PO TABS
5.0000 mg | ORAL_TABLET | Freq: Every day | ORAL | Status: DC
  Filled 2018-06-12 (×4): qty 1

## 2018-06-12 MED ORDER — INSULIN ASPART 100 UNIT/ML ~~LOC~~ SOLN
0.0000 [IU] | Freq: Three times a day (TID) | SUBCUTANEOUS | Status: DC
  Administered 2018-06-12 (×2): 3 [IU] via SUBCUTANEOUS
  Administered 2018-06-12 – 2018-06-13 (×2): 2 [IU] via SUBCUTANEOUS
  Administered 2018-06-13: 3 [IU] via SUBCUTANEOUS
  Administered 2018-06-14 (×2): 2 [IU] via SUBCUTANEOUS
  Administered 2018-06-14: 1 [IU] via SUBCUTANEOUS
  Administered 2018-06-15: 2 [IU] via SUBCUTANEOUS
  Administered 2018-06-15: 1 [IU] via SUBCUTANEOUS
  Administered 2018-06-15: 2 [IU] via SUBCUTANEOUS
  Administered 2018-06-16 (×2): 3 [IU] via SUBCUTANEOUS
  Administered 2018-06-17: 5 [IU] via SUBCUTANEOUS
  Administered 2018-06-17: 2 [IU] via SUBCUTANEOUS

## 2018-06-12 NOTE — Tx Team (Signed)
Interdisciplinary Treatment and Diagnostic Plan Update  06/12/2018 Time of Session: [place] MRN: [id number]  Principal Diagnosis: <principal problem not specified>  Secondary Diagnoses: Active Problems:   MDD (major depressive disorder), recurrent, severe, with psychosis ([place])   Current Medications:  Current Facility-Administered Medications  Medication Dose Route Frequency Provider Last Rate Last Dose  . acetaminophen (TYLENOL) tablet 650 mg  650 mg Oral Q6H PRN Suella Broad, FNP      . alum & mag hydroxide-simeth (MAALOX/MYLANTA) 200-200-20 MG/5ML suspension 30 mL  30 mL Oral Q4H PRN Burt Ek, Gayland Curry, FNP      . ARIPiprazole (ABILIFY) tablet 5 mg  5 mg Oral Daily Johnn Hai, MD      . ciprofloxacin (CIPRO) tablet 500 mg  500 mg Oral BID Suella Broad, FNP   500 mg at 06/12/18 0753  . clonazePAM (KLONOPIN) tablet 0.25 mg  0.25 mg Oral TID Johnn Hai, MD      . hydrOXYzine (ATARAX/VISTARIL) tablet 25 mg  25 mg Oral TID PRN Suella Broad, FNP      . insulin aspart (novoLOG) injection 0-9 Units  0-9 Units Subcutaneous TID WC Rozetta Nunnery, NP   2 Units at 06/12/18 0716  . insulin glargine (LANTUS) injection 22 Units  22 Units Subcutaneous Daily Cobos, Myer Peer, MD   22 Units at 06/12/18 [phone]  . levothyroxine (SYNTHROID, LEVOTHROID) tablet 88 mcg  88 mcg Oral QAC breakfast Suella Broad, FNP   88 mcg at 06/12/18 3154  . losartan (COZAAR) tablet 100 mg  100 mg Oral Daily Suella Broad, FNP   100 mg at 06/12/18 0753  . magnesium hydroxide (MILK OF MAGNESIA) suspension 30 mL  30 mL Oral Daily PRN Starkes-Perry, Gayland Curry, FNP      . pravastatin (PRAVACHOL) tablet 80 mg  80 mg Oral Daily Starkes-Perry, Gayland Curry, FNP      . sertraline (ZOLOFT) tablet 100 mg  100 mg Oral Daily Suella Broad, FNP   100 mg at 06/12/18 0753  . traZODone (DESYREL) tablet 50 mg  50 mg Oral QHS PRN Suella Broad, FNP   50 mg at  06/11/18 2123   PTA Medications: Medications Prior to Admission  Medication Sig Dispense Refill Last Dose  . insulin aspart (NOVOLOG) 100 UNIT/ML injection Inject 0-15 Units into the skin 3 (three) times daily before meals. Per sliding scale   06/10/2018 at Unknown time  . insulin degludec (TRESIBA FLEXTOUCH) 100 UNIT/ML SOPN FlexTouch Pen Inject 22 Units into the skin daily.   06/10/2018 at Unknown time  . levothyroxine (SYNTHROID, LEVOTHROID) 88 MCG tablet Take 88 mcg by mouth daily before breakfast.   06/10/2018 at Unknown time  . losartan (COZAAR) 100 MG tablet Take 100 mg by mouth daily.   06/10/2018 at Unknown time  . pravastatin (PRAVACHOL) 80 MG tablet Take 80 mg by mouth daily.   06/10/2018 at Unknown time  . sertraline (ZOLOFT) 100 MG tablet Take 100 mg by mouth daily.  5 Past Week at Unknown time    Patient Stressors: Other: Pt's belief that she is being investigated for theft (not substantiated)  Patient Strengths: Communication skills Supportive family/friends  Treatment Modalities: Medication Management, Group therapy, Case management,  1 to 1 session with clinician, Psychoeducation, Recreational therapy.   Physician Treatment Plan for Primary Diagnosis: <principal problem not specified> Long Term Goal(s): Improvement in symptoms so as ready for discharge Improvement in symptoms so as ready for discharge  Short Term Goals: Ability to identify changes in lifestyle to reduce recurrence of condition will improve Ability to demonstrate self-control will improve Ability to identify and develop effective coping behaviors will improve Compliance with prescribed medications will improve Ability to identify triggers associated with substance abuse/mental health issues will improve  Medication Management: Evaluate patient's response, side effects, and tolerance of medication regimen.  Therapeutic Interventions: 1 to 1 sessions, Unit Group sessions and Medication  administration.  Evaluation of Outcomes: Not Met  Physician Treatment Plan for Secondary Diagnosis: Active Problems:   MDD (major depressive disorder), recurrent, severe, with psychosis ([place])  Long Term Goal(s): Improvement in symptoms so as ready for discharge Improvement in symptoms so as ready for discharge   Short Term Goals: Ability to identify changes in lifestyle to reduce recurrence of condition will improve Ability to demonstrate self-control will improve Ability to identify and develop effective coping behaviors will improve Compliance with prescribed medications will improve Ability to identify triggers associated with substance abuse/mental health issues will improve     Medication Management: Evaluate patient's response, side effects, and tolerance of medication regimen.  Therapeutic Interventions: 1 to 1 sessions, Unit Group sessions and Medication administration.  Evaluation of Outcomes: Not Met   RN Treatment Plan for Primary Diagnosis: <principal problem not specified> Long Term Goal(s): Knowledge of disease and therapeutic regimen to maintain health will improve  Short Term Goals: Ability to identify and develop effective coping behaviors will improve and Compliance with prescribed medications will improve  Medication Management: RN will administer medications as ordered by provider, will assess and evaluate patient's response and provide education to patient for prescribed medication. RN will report any adverse and/or side effects to prescribing provider.  Therapeutic Interventions: 1 on 1 counseling sessions, Psychoeducation, Medication administration, Evaluate responses to treatment, Monitor vital signs and CBGs as ordered, Perform/monitor CIWA, COWS, AIMS and Fall Risk screenings as ordered, Perform wound care treatments as ordered.  Evaluation of Outcomes: Not Met   LCSW Treatment Plan for Primary Diagnosis: <principal problem not specified> Long Term  Goal(s): Safe transition to appropriate next level of care at discharge, Engage patient in therapeutic group addressing interpersonal concerns.  Short Term Goals: Engage patient in aftercare planning with referrals and resources, Identify triggers associated with mental health/substance abuse issues and Increase skills for wellness and recovery  Therapeutic Interventions: Assess for all discharge needs, 1 to 1 time with Social worker, Explore available resources and support systems, Assess for adequacy in community support network, Educate family and significant other(s) on suicide prevention, Complete Psychosocial Assessment, Interpersonal group therapy.  Evaluation of Outcomes: Not Met   Progress in Treatment: Attending groups: No. Participating in groups: No. Taking medication as prescribed: Yes. Toleration medication: Yes. Family/Significant other contact made: No, will contact:  when given permission Patient understands diagnosis: No. Discussing patient identified problems/goals with staff: Yes. Medical problems stabilized or resolved: Yes. Denies suicidal/homicidal ideation: Yes. Issues/concerns per patient self-inventory: No. Other: none  New problem(s) identified: No, Describe:  none  New Short Term/Long Term Goal(s):  Patient Goals:  "get back to the way things were"  Discharge Plan or Barriers:   Reason for Continuation of Hospitalization: Delusions  Depression Medication stabilization  Estimated Length of Stay: 3-5 days  Attendees: Patient: Barbara Forbes 06/12/2018   Physician: Dr Jake Samples, MD 06/12/2018   Nursing: Neldon Newport, RN 06/12/2018   RN Care Manager: 06/12/2018   Social Worker: Lurline Idol, LCSW 06/12/2018   Recreational Therapist:  06/12/2018  Other:  06/12/2018  Other:  06/12/2018   Other: 06/12/2018        Scribe for Treatment Team: Joanne Chars, [place] 06/12/2018 11:34 AM

## 2018-06-12 NOTE — H&P (Signed)
Psychiatric Admission Assessment Adult  Patient Identification: Barbara Forbes  MRN:  [id number]  Date of Evaluation:  06/12/2018  Chief Complaint: Worsening symptoms of depression, anxiety & paranoid ideations.  Principal Diagnosis: MDD (major depressive disorder), recurrent, severe, with psychosis ([place])  Diagnosis:   Patient Active Problem List   Diagnosis Date Noted  . MDD (major depressive disorder), recurrent, severe, with psychosis ([place]) [F33.3] 06/11/2018    Priority: High  . 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 Thyroiditis, lymphocytic [E06.3] 10/30/2013  . Chest pain [R07.9] 01/23/2012  . Palpitations [R00.2] 01/23/2012   History of Present Illness: This is the first psychiatric admission assessment for this 57 year old married Caucasian female. Ad[facility] ED with complaints of odd behavior of 2 days, paranoia & suicidal threats. She was brought to the hospital for psychiatric evaluation & possible treatment.  During this assessment, Ali reports, "My husband took me to the hospital on Monday, 2 days ago. I did not want to go, but, he told me that I had no choice but to go. Some thing is going on in my head for the last 8 weeks. It is like someone is actively investigating me. So, I became highly sensitive, uncomfortable & paranoid to what people say around me & also I became sensitive to what was going on,  on TV. This has been going on x 8 weeks. I think the trigger was what I had done a long time ago. I stole money form my employer. Someone that I work with now knew about this my secret & had told other people about it. Now that my secret is in the open, I'm afraid that I will be accused of other things too. I have been feeling very depressed & anxious for about 8 weeks now. I'm scared, worried & have not been sleeping at night for 2 weeks. I have no appetite. I slept well here last night. I was  started on depression medicine called Zoloft about 2 weeks ago.  I took one or 2 tablets of it & stopped because I read on line that this medicine will cause you to have suicidal thoughts & paranoia. I'm afraid that my daughter will not be able to forgive me & my husband is suspecting me of doing other awful things as well".  Associated Signs/Symptoms:  Depression Symptoms:  depressed mood, insomnia, feelings of worthlessness/guilt, anxiety, decreased appetite,  (Hypo) Manic Symptoms:  Labiality of Mood,  Anxiety Symptoms:  Excessive Worry,  Psychotic Symptoms:  Paranoia,  PTSD Symptoms: Denies any PTSD symptoms or events.  Total Time spent with patient: 1 hour  Past Psychiatric History: Impulsive stealing.  Is the patient at risk to self? Yes.   (Passive ideations, denies any plans or intent) Has the patient been a risk to self in the past 6 months? Yes.    Has the patient been a risk to self within the distant past? No.  Is the patient a risk to others? No.  Has the patient been a risk to others in the past 6 months? No.  Has the patient been a risk to others within the distant past? No.   Prior Inpatient Therapy: No Prior Outpatient Therapy: Yes.  Alcohol Screening: 1. How often do you have a drink containing alcohol?: Never 2. How many drinks containing alcohol do you have on a typical day when you are drinking?: 1 or 2 3. How often do you  have six or more drinks on one occasion?: Never AUDIT-C Score: 0 4. How often during the last year have you found that you were not able to stop drinking once you had started?: Never 5. How often during the last year have you failed to do what was normally expected from you becasue of drinking?: Never 6. How often during the last year have you needed a first drink in the morning to get yourself going after a heavy drinking session?: Never 7. How often during the last year have you had a feeling of guilt of remorse after drinking?:  Never 8. How often during the last year have you been unable to remember what happened the night before because you had been drinking?: Never 9. Have you or someone else been injured as a result of your drinking?: No 10. Has a relative or friend or a doctor or another health worker been concerned about your drinking or suggested you cut down?: No Alcohol Use Disorder Identification Test Final Score (AUDIT): 0  Substance Abuse History in the last 12 months:  No.  Consequences of Substance Abuse: NA  Previous Psychotropic Medications: Yes, Sertraline  Psychological Evaluations: No   Past Medical History:  Past Medical History:  Diagnosis Date  . Arthritis   . Diabetes mellitus    since age 46  . Diabetic neuropathy ([place])   . Fatigue   . Head ache   . Hyperlipidemia   . Hypertension     Past Surgical History:  Procedure Laterality Date  . ABDOMINAL HYSTERECTOMY  2004  . CESAREAN SECTION  1989   Family History:  Family History  Problem Relation Age of Onset  . Stroke Mother   . Lupus Mother   . Cancer Father        Lung/brain  . Coronary artery disease Father    Family Psychiatric  History: Dementia: Mother.                        Major depression with psychosis: Mother.  Tobacco Screening: Have you used any form of tobacco in the last 30 days? (Cigarettes, Smokeless Tobacco, Cigars, and/or Pipes): No  Social History: Married, lives in [place] with husband, has 1 daughter, employed. Social History   Substance and Sexual Activity  Alcohol Use No     Social History   Substance and Sexual Activity  Drug Use No    Additional Social History:  Allergies:   Allergies  Allergen Reactions  . Penicillins Rash    Has patient had a PCN reaction causing immediate rash, facial/tongue/throat swelling, SOB or lightheadedness with hypotension: Yes Has patient had a PCN reaction causing severe rash involving mucus membranes or skin necrosis: No Has patient had a PCN  reaction that required hospitalization: Unknown Has patient had a PCN reaction occurring within the last 10 years: No If all of the above answers are "NO", then may proceed with Cephalosporin use.   . Sulfa Antibiotics Rash  . Sulfamethoxazole Rash   Lab Results:  Results for orders placed or performed during the hospital encounter of 06/11/18 (from the past 48 hour(s))  Glucose, capillary     Status: Abnormal   Collection Time: 06/11/18  5:32 PM  Result Value Ref Range   Glucose-Capillary 192 (H) 70 - 99 mg/dL  Glucose, capillary     Status: Abnormal   Collection Time: 06/11/18  8:10 PM  Result Value Ref Range   Glucose-Capillary 177 (H) 70 - 99 mg/dL  Glucose, capillary     Status: Abnormal   Collection Time: 06/12/18  6:37 AM  Result Value Ref Range   Glucose-Capillary 166 (H) 70 - 99 mg/dL  Glucose, capillary     Status: Abnormal   Collection Time: 06/12/18 12:05 PM  Result Value Ref Range   Glucose-Capillary 216 (H) 70 - 99 mg/dL   Blood Alcohol level:  Lab Results  Component Value Date   ETH <10 11/94/1740   Metabolic Disorder Labs:  No results found for: HGBA1C, MPG No results found for: PROLACTIN No results found for: CHOL, TRIG, HDL, CHOLHDL, VLDL, LDLCALC  Current Medications: Current Facility-Administered Medications  Medication Dose Route Frequency Provider Last Rate Last Dose  . acetaminophen (TYLENOL) tablet 650 mg  650 mg Oral Q6H PRN Suella Broad, FNP      . alum & mag hydroxide-simeth (MAALOX/MYLANTA) 200-200-20 MG/5ML suspension 30 mL  30 mL Oral Q4H PRN Burt Ek, Gayland Curry, FNP      . ARIPiprazole (ABILIFY) tablet 5 mg  5 mg Oral Daily Johnn Hai, MD      . ciprofloxacin (CIPRO) tablet 500 mg  500 mg Oral BID Suella Broad, FNP   500 mg at 06/12/18 0753  . clonazePAM (KLONOPIN) tablet 0.25 mg  0.25 mg Oral TID Johnn Hai, MD      . hydrOXYzine (ATARAX/VISTARIL) tablet 25 mg  25 mg Oral TID PRN Suella Broad, FNP      .  insulin aspart (novoLOG) injection 0-9 Units  0-9 Units Subcutaneous TID WC Lindon Romp A, NP   3 Units at 06/12/18 1213  . insulin glargine (LANTUS) injection 22 Units  22 Units Subcutaneous Daily Cobos, Myer Peer, MD   22 Units at 06/12/18 [phone]  . levothyroxine (SYNTHROID, LEVOTHROID) tablet 88 mcg  88 mcg Oral QAC breakfast Suella Broad, FNP   88 mcg at 06/12/18 8185  . losartan (COZAAR) tablet 100 mg  100 mg Oral Daily Suella Broad, FNP   100 mg at 06/12/18 0753  . magnesium hydroxide (MILK OF MAGNESIA) suspension 30 mL  30 mL Oral Daily PRN Starkes-Perry, Gayland Curry, FNP      . pravastatin (PRAVACHOL) tablet 80 mg  80 mg Oral Daily Suella Broad, FNP   80 mg at 06/12/18 1211  . sertraline (ZOLOFT) tablet 100 mg  100 mg Oral Daily Suella Broad, FNP   100 mg at 06/12/18 0753  . traZODone (DESYREL) tablet 50 mg  50 mg Oral QHS PRN Suella Broad, FNP   50 mg at 06/11/18 2123   PTA Medications: Medications Prior to Admission  Medication Sig Dispense Refill Last Dose  . insulin aspart (NOVOLOG) 100 UNIT/ML injection Inject 0-15 Units into the skin 3 (three) times daily before meals. Per sliding scale   06/10/2018 at Unknown time  . insulin degludec (TRESIBA FLEXTOUCH) 100 UNIT/ML SOPN FlexTouch Pen Inject 22 Units into the skin daily.   06/10/2018 at Unknown time  . levothyroxine (SYNTHROID, LEVOTHROID) 88 MCG tablet Take 88 mcg by mouth daily before breakfast.   06/10/2018 at Unknown time  . losartan (COZAAR) 100 MG tablet Take 100 mg by mouth daily.   06/10/2018 at Unknown time  . pravastatin (PRAVACHOL) 80 MG tablet Take 80 mg by mouth daily.   06/10/2018 at Unknown time  . sertraline (ZOLOFT) 100 MG tablet Take 100 mg by mouth daily.  5 Past Week at Unknown time   Musculoskeletal: Strength & Muscle Tone: within normal limits Gait &  Station: normal Patient leans: N/A  Psychiatric Specialty Exam: Physical Exam  Constitutional: She appears  well-developed.  HENT:  Head: Normocephalic.  Neck: Normal range of motion.  Cardiovascular:  Elevated pulse rate (109)  Respiratory: Effort normal. No respiratory distress. She has no wheezes. She has no rales. She exhibits no tenderness.  GI: Soft.  Genitourinary:  Genitourinary Comments: Deferred  Musculoskeletal: Normal range of motion.  Neurological: She is alert.  Skin: Skin is warm and dry.    Review of Systems  Constitutional: Negative.   HENT: Negative.   Eyes: Negative.   Respiratory: Negative.  Negative for cough and shortness of breath.   Cardiovascular:       Elevated pulse rate (109)  Gastrointestinal: Negative.  Negative for heartburn, nausea and vomiting.  Genitourinary: Negative.   Musculoskeletal: Negative.   Skin: Negative.   Neurological: Negative.   Endo/Heme/Allergies: Negative.   Psychiatric/Behavioral: Positive for depression. Negative for memory loss, substance abuse and suicidal ideas. The patient is nervous/anxious and has insomnia.     Blood pressure 110/63, pulse (!) 109, temperature 98.4 F (36.9 C), temperature source Oral, resp. rate 20, height 5' 1.5" (1.562 m), weight 69.4 kg.Body mass index is 28.44 kg/m.  General Appearance: Casual and Well Groomed, Guarded  Eye Contact:  Good  Speech:  Clear and Coherent and Normal Rate  Volume:  Normal  Mood:  Anxious and Depressed  Affect:  Flat and Tearful  Thought Process:  Coherent and Descriptions of Associations: Intact  Orientation:  Full (Time, Place, and Person)  Thought Content:  Paranoid Ideation and Rumination  Suicidal Thoughts:  Currently denies any thoughts, plans or intents. (Able to contract for safety)  Homicidal Thoughts:  Denies  Memory:  Immediate;   Good Recent;   Good Remote;   Good  Judgement:  Fair  Insight:  Fair  Psychomotor Activity:  Extremely nervouse  Concentration:  Concentration: Fair and Attention Span: Fair  Recall:  Good  Fund of Knowledge:  Good  Language:   Good  Akathisia:  No  Handed:  Right  AIMS (if indicated):     Assets:  Communication Skills Desire for Improvement Social Support  ADL's:  Intact  Cognition:  WNL  Sleep:  Number of Hours: 6.75   Treatment Plan/Recommendations: 1. Admit for crisis management and stabilization, estimated length of stay 3-5 days.   2. Medication management to reduce current symptoms to base line and improve the patient's overall level of functioning: See MAR, Md's SRA & treatment plan.   Observation Level/Precautions:  15 minute checks  Laboratory:  Hgba1c, lipid profile, Prolactin level  Psychotherapy: Group milieu  Medications: See MAR    Consultations: As needed  Discharge Concerns: safety, mood stability.    Estimated LOS: 5-7 days  Other: Admit to the 500 hall.   Physician Treatment Plan for Primary Diagnosis: MDD (major depressive disorder), recurrent, severe, with psychosis ([place])  Long Term Goal(s): Improvement in symptoms so as ready for discharge  Short Term Goals: Ability to identify changes in lifestyle to reduce recurrence of condition will improve and Ability to demonstrate self-control will improve  Physician Treatment Plan for Secondary Diagnosis: Principal Problem:   MDD (major depressive disorder), recurrent, severe, with psychosis ([place])  Long Term Goal(s): Improvement in symptoms so as ready for discharge  Short Term Goals: Ability to identify and develop effective coping behaviors will improve, Compliance with prescribed medications will improve and Ability to identify triggers associated with substance abuse/mental health issues will improve  I  certify that inpatient services furnished can reasonably be expected to improve the patient's condition.    Lindell Spar, NP, PMHNP, FNP-BC 11/13/20191:54 PM

## 2018-06-12 NOTE — Progress Notes (Signed)
Patient has been on the hall actively engaged in unit activities, compliant with medications and attended groups.  Patient remains anxious fearful and paranoid.  Patient has self loathing thoughts and guilt but she isn't able to tell if her guilt is because of a real event or a delusion.   Assess patient for safety, offer medications as prescribed, engage patient in 1:1 staff talks.   Patient able to contract for safety, continue to monitor as planned.

## 2018-06-12 NOTE — Progress Notes (Signed)
Adult Psychoeducational Group Note  Date:  06/12/2018 Time:  8:29 PM  Group Topic/Focus:  Wrap-Up Group:   The focus of this group is to help patients review their daily goal of treatment and discuss progress on daily workbooks.  Participation Level:  Active  Participation Quality:  Appropriate  Affect:  Appropriate  Cognitive:  Appropriate  Insight: Appropriate  Engagement in Group:  Engaged  Modes of Intervention:  Discussion  Additional Comments: The patient expressed that she attended groups.The patient also said that her goal is to have a better day tomorrow.  Barbara Forbes 06/12/2018, 8:29 PM

## 2018-06-12 NOTE — BHH Suicide Risk Assessment (Signed)
[facility] Admission Suicide Risk Assessment   Nursing information obtained from:  Patient Demographic factors:  Caucasian, Access to firearms Current Mental Status:  NA Loss Factors:  NA Historical Factors:  Family history of mental illness or substance abuse Risk Reduction Factors:  Religious beliefs about death, Living with another person, especially a relative, Positive social support  Total Time spent with patient: 15 minutes Principal Problem: <principal problem not specified> Diagnosis:   Patient Active Problem List   Diagnosis Date Noted  . MDD (major depressive disorder), recurrent, severe, with psychosis ([place]) [F33.3] 06/11/2018  . 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 Thyroiditis, lymphocytic [E06.3] 10/30/2013  . Chest pain [R07.9] 01/23/2012  . Palpitations [R00.2] 01/23/2012   Subjective Data: Patient presents with what appears to be a depression with psychotic features marked by paranoia somewhat guarded and anxious even today on team meeting  Continued Clinical Symptoms:  Alcohol Use Disorder Identification Test Final Score (AUDIT): 0 The "Alcohol Use Disorders Identification Test", Guidelines for Use in Primary Care, Second Edition.  World Pharmacologist [facility]). Score between 0-7:  no or low risk or alcohol related problems. Score between 8-15:  moderate risk of alcohol related problems. Score between 16-19:  high risk of alcohol related problems. Score 20 or above:  warrants further diagnostic evaluation for alcohol dependence and treatment.   CLINICAL FACTORS:   Depression:   Anhedonia   Musculoskeletal: Strength & Muscle Tone: within normal limits Gait & Station: normal Patient leans: N/A  Psychiatric Specialty Exam: Physical Exam  ROS  Blood pressure 110/63, pulse (!) 109, temperature 98.4 F (36.9 C), temperature source Oral, resp. rate 20, height 5' 1.5" (1.562 m), weight 69.4 kg.Body mass index is 28.44 kg/m.  General  Appearance: Casual  Eye Contact:  Good  Speech:  Clear and Coherent  Volume:  Decreased  Mood:  Anxious and Depressed  Affect:  Congruent  Thought Process:  Coherent  Orientation:  Full (Time, Place, and Person)  Thought Content:  Rumination  Suicidal Thoughts:  Yes.  without intent/plan  Homicidal Thoughts:  No  Memory:  Immediate;   Good  Judgement:  Good  Insight:  Good  Psychomotor Activity:  Normal  Concentration:  Concentration: Good  Recall:  Good  Fund of Knowledge:  Good  Language:  Good  Akathisia:  Negative  Handed:  Right  AIMS (if indicated):     Assets:  Social Support  ADL's:  Intact  Cognition:  WNL  Sleep:  Number of Hours: 6.75      COGNITIVE FEATURES THAT CONTRIBUTE TO RISK:  None    SUICIDE RISK:   Minimal: No identifiable suicidal ideation.  Patients presenting with no risk factors but with morbid ruminations; may be classified as minimal risk based on the severity of the depressive symptoms  PLAN OF CARE: See HPI meds adjusted  I certify that inpatient services furnished can reasonably be expected to improve the patient's condition.   Johnn Hai, MD 06/12/2018, 9:46 AM

## 2018-06-12 NOTE — Progress Notes (Signed)
Recreation Therapy Notes  INPATIENT RECREATION THERAPY ASSESSMENT  Patient Details Name: Barbara Forbes MRN: [id number] DOB: 08-28-1960 Today's Date: 06/12/2018       Information Obtained From: Patient  Able to Participate in Assessment/Interview: Yes  Patient Presentation: Alert(Guarded)  Reason for Admission (Per Patient): Suicidal Ideation, Other (Comments)(Pt stated her husband brought her here)  Patient Stressors: Other (Comment)(Pt stated she thought she was under investigation)  Coping Skills:   Journal, Sports, TV, Music, Prayer, Avoidance, Read, Hot Bath/Shower  Leisure Interests (2+):  Community - Travel (Comment), Community - Other (Comment)(Go out to eat)  Frequency of Recreation/Participation: (Go out to eat- Weekly; Travel- Yearly)  Awareness of Community Resources:  Yes  Community Resources:  [place], Patent examiner, Art therapist  Current Use: Yes  If no, Barriers?:    Expressed Interest in [place]: No  [employer] of Residence:  Lobbyist  Patient Main Form of Transportation: Musician  Patient Strengths:  Manufacturing engineer; Help people in need  Patient Identified Areas of Improvement:  "I don't know"  Patient Goal for Hospitalization:  "Get back old life, the way it was"  Current SI (including self-harm):  No  Current HI:  No  Current AVH: No  Staff Intervention Plan: Group Attendance, Collaborate with Interdisciplinary Treatment Team  Consent to Intern Participation: N/A    Victorino Sparrow, LRT/CTRS  Ria Comment, Cynthia Cogle A 06/12/2018, 2:01 PM

## 2018-06-12 NOTE — BHH Counselor (Signed)
Adult Comprehensive Assessment  Patient ID: Barbara Forbes, female   DOB: 1961-03-13, 57 y.o.   MRN: [id number]  Information Source: Information source: Patient  Current Stressors:  Patient states their primary concerns and needs for treatment are:: Barbara Forbes had been suicidal due to excessive worry and paranoia about an old friend from the church telling her husband about her past behaviors.   Patient states their goals for this hospitilization and ongoing recovery are:: She wants life to go back to what it was like before she began worrying and being paranoid Family Relationships: She has disclosed these past events to her husband and daughter who are concerned about her well-being. Physical health (include injuries & life threatening diseases): Patient reported Diabetes Type I Social relationships: She loves her church family that she has been a part of since she was born but now she worries that everyone has been talking about her  Living/Environment/Situation:  Living Arrangements: Spouse/significant other Living conditions (as described by patient or guardian): Lately it has been stressful with some arguments How long has patient lived in current situation?: since [place] is atmosphere in current home: Comfortable  Family History:  Marital status: Married Number of Years Married: 32 What types of issues is patient dealing with in the relationship?: Barbara Forbes has been experiencing this paranoia for a couple months. Additionally, she had worried about whether her husband has been faithful. Additional relationship information: Barbara Forbes had been spending a lot of time worrying about whether he has been faithful on and off through their marriage. She began seeing a counselor at that time. Are you sexually active?: Yes(Her and her husband are experiencing difficulty having sex) What is your sexual orientation?: straight Has your sexual activity been affected by drugs, alcohol, medication, or emotional  stress?: She thinks emotional stress could be affecting their sexual activity Does patient have children?: Yes How many children?: 1 How is patient's relationship with their children?: Right now she is very worried about what she is thinking, she loves her daughter and never wanted to hurt her  Childhood History:  By whom was/is the patient raised?: Both parents Additional childhood history information: Dad was a Pension scheme manager and then he became the Museum/gallery conservator and mom worked at [employer]. Description of patient's relationship with caregiver when they were a child: Pretty good overall they were good to me. Mom was a little strict Patient's description of current relationship with people who raised him/her: It is good How were you disciplined when you got in trouble as a child/adolescent?: Spankings Does patient have siblings?: No Did patient suffer any verbal/emotional/physical/sexual abuse as a child?: No Did patient suffer from severe childhood neglect?: No Has patient ever been sexually abused/assaulted/raped as an adolescent or adult?: No Was the patient ever a victim of a crime or a disaster?: No Witnessed domestic violence?: No Has patient been effected by domestic violence as an adult?: No  Education:  Highest grade of school patient has completed: 92 years Currently a student?: No Learning disability?: No  Employment/Work Situation:   Employment situation: Employed Where is patient currently employed?: [employer] How long has patient been employed?: August 2017 Patient's job has been impacted by current illness: Yes Describe how patient's job has been impacted: She was unable to work over the past week What is the longest time patient has a held a job?: 5 years Where was the patient employed at that time?: [employer] Did You Receive Any Psychiatric Treatment/Services While in [employer]?:  No Are There Guns or Other Weapons in [place]?: Yes Types of  Guns/Weapons: Several guns, 1 shotgun, 1 pink gun, and others my husband has a handgun Are These Weapons Safely Secured?: No Who Could Verify You Are Able To Have These Secured:: Husband Futures trader Resources:   Financial resources: Income from employment, Income from spouse, Private insurance Does patient have a representative payee or guardian?: No  Alcohol/Substance Abuse:   What has been your use of drugs/alcohol within the last 12 months?: Went out with a friend and had a drink at [employer], no alcohol, no prescriptions  If attempted suicide, did drugs/alcohol play a role in this?: No Alcohol/Substance Abuse Treatment Hx: Denies past history Has alcohol/substance abuse ever caused legal problems?: No  Social Support System:   Pensions consultant Support System: Fair Astronomer System: I have a lot of friends who are trustworthy Type of faith/religion: Baptist How does patient's faith help to cope with current illness?: It helps through pray and through his word  Leisure/Recreation:   Leisure and Hobbies: I like to travel, like to go out and eat, enjoy diet classes ([employer], set up for 8 weeks, which she has done many times)  Strengths/Needs:   What is the patient's perception of their strengths?: I care for the community, I served on the [employer], I volunteered your time for community Patient states they can use these personal strengths during their treatment to contribute to their recovery: Reflecting on the good that we did in the community. 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 Patient states these barriers may affect/interfere with their treatment: None reported Patient states these barriers may affect their return to the community: None reported Other important information patient would like considered in planning for their treatment: None reported  Discharge Plan:   Currently receiving community mental health services:  Yes (From Whom)(Dr. Gilles Chiquito, [facility]) Patient states concerns and preferences for aftercare planning are: None reported Patient states they will know when they are safe and ready for discharge when: She stated that when she can go back to living like before she began thinking intrusive paranoid and excessive worrying Does patient have access to transportation?: Yes Does patient have financial barriers related to discharge medications?: No Patient description of barriers related to discharge medications: None reported Will patient be returning to same living situation after discharge?: Yes  Summary/Recommendations:   Summary and Recommendations (to be completed by the evaluator): Catelynn "Barbara Forbes" is a 57 yo White Caucasian female who is diagnosed with MDD (major depressive disorder), recurrent, severe, with psychosis. She presents voluntarily with anxiety, paranoia, excessive worrying, thought blocking, inability to focus, tangential thoughts and suicidal ideation after her husband asked her to come to the hospital. She stated she would like to continue therapeutic supports in the community with Dr. Gilles Chiquito in [facility]. Her hospital follow up appointment is unknown at this time. While here she may benefit from crises stabiliization, medication management, a therapeutic milieu and referral for services.[name], MSW Intern 06/12/2018

## 2018-06-13 LAB — GLUCOSE, CAPILLARY
GLUCOSE-CAPILLARY: 159 mg/dL — AB (ref 70–99)
Glucose-Capillary: 189 mg/dL — ABNORMAL HIGH (ref 70–99)
Glucose-Capillary: 221 mg/dL — ABNORMAL HIGH (ref 70–99)
Glucose-Capillary: 108 mg/dL — ABNORMAL HIGH (ref 70–99)

## 2018-06-13 NOTE — Plan of Care (Signed)
  Problem: Education: Goal: Knowledge of the prescribed therapeutic regimen will improve Outcome: Progressing Note:  Barbara Forbes does ask questions about her medications however, she is very anxious and fearful to take medications at this time.

## 2018-06-13 NOTE — Progress Notes (Signed)
Inpatient Diabetes Program Recommendations  AACE/ADA: New Consensus Statement on Inpatient Glycemic Control (2015)  Target Ranges:  Prepandial:   less than 140 mg/dL      Peak postprandial:   less than 180 mg/dL (1-2 hours)      Critically ill patients:  140 - 180 mg/dL   Lab Results  Component Value Date   CHEKBT 248 (H) 06/13/2018    Review of Glycemic Control  Diabetes history: DM since age 57 Outpatient Diabetes medications: Tresiba 26 units QD, Novolog 1:20 CHO ratio and CF - 50 Current orders for Inpatient glycemic control: Lantus 22 units QD, Novolog 0-9 units tidwc  Inpatient Diabetes Program Recommendations:     Increase Lantus to 26 units QD Add Novolog HS correction Novolog 4 units tidwc for meal coverage insulin if pt eats "regular meal tray."  Will follow closely.  Thank you. Lorenda Peck, RD, LDN, CDE Inpatient Diabetes Coordinator [phone]

## 2018-06-13 NOTE — Plan of Care (Signed)
  Problem: Medication: Goal: Compliance with prescribed medication regimen will improve Outcome: Not Progressing   Problem: Activity: Goal: Interest or engagement in activities will improve Outcome: Not Progressing   Problem: Safety: Goal: Periods of time without injury will increase Outcome: Progressing  DAR NOTE: Patient presents with anxious affect and paranoid.  Denies suicidal thoughts, pain, auditory and visual hallucinations.  Refused Klonopin and Abilify due to side effects listed on Web link.  Described energy level as low and concentration as average.  Rates depression at 8, hopelessness at 5, and anxiety at 8.  Maintained on routine safety checks.  Medications given as prescribed.  Support and encouragement offered as needed.  Attended group and participated.  States goal for today is "leaving."  Minimal interaction with staff and peers due to paranoia.  Patient is safe on and off the unit.  Offered no complaint.  MD made aware of medication refusal.

## 2018-06-13 NOTE — BHH Suicide Risk Assessment (Signed)
[place] INPATIENT:  Family/Significant Other Suicide Prevention Education  Suicide Prevention Education:  Education Completed; husband Barbara Forbes, [phone]  (name of family member/significant other) has been identified by the patient as the family member/significant other with whom the patient will be residing, and identified as the person(s) who will aid the patient in the event of a mental health crisis (suicidal ideations/suicide attempt).  With written consent from the patient, the family member/significant other has been provided the following suicide prevention education, prior to the and/or following the discharge of the patient.  The suicide prevention education provided includes the following:  Suicide risk factors  Suicide prevention and interventions  National Suicide Hotline telephone number  [facility] assessment telephone number  [facility] Emergency Assistance [place] and/or Residential Mobile Crisis Unit telephone number  Request made of family/significant other to:  Remove weapons (e.g., guns, rifles, knives), all items previously/currently identified as safety concern. Barbara Forbes described a shotgun, pistol, small guns unsecured. After discussion Barbara Forbes agreed to have them removed and stored securely with a friend.   Remove drugs/medications (over-the-counter, prescriptions, illicit drugs), all items previously/currently identified as a safety concern.   The family member/significant other verbalizes understanding of the suicide prevention education information provided. The family member/significant other agrees to remove the items of safety concern listed above.  Barbara Forbes 06/13/2018, 11:07 AM

## 2018-06-13 NOTE — BHH Group Notes (Signed)
LCSW Group Therapy Note  06/13/2018 3:20 PM  Type of Therapy/Topic:  Group Therapy:  Balance in Life  Participation Level:  Did Not Attend  Description of Group:     This group will address the concept of balance and how it feels and looks when one is unbalanced. Patients will be encouraged to process areas in their lives that are out of balance and identify reasons for remaining unbalanced. Facilitators will guide patients in utilizing problem-solving interventions to address and correct the stressor making their life unbalanced. Understanding and applying boundaries will be explored and addressed for obtaining and maintaining a balanced life. Patients will be encouraged to explore ways to assertively make their unbalanced needs known to significant others in their lives, using other group members and facilitator for support and feedback.  Therapeutic Goals:  1.     Patient will identify two or more emotions or situations they have that consume much of in their lives. 2.     Patient will identify signs/triggers that life has become out of balance: 3.     Patient will identify two ways to set boundaries in order to achieve balance in their lives: 4.     Patient will demonstrate ability to communicate their needs through discussion and/or role plays  Summary of Patient Progress:  Therapeutic Modalities:   Cognitive Behavioral Therapy Solution-Focused Therapy Assertiveness Training  [name] MSW Intern Phone: [phone] Fax: [phone]

## 2018-06-13 NOTE — BHH Group Notes (Signed)
Adult Psychoeducational Group Note  Date:  06/13/2018 Time:  9:05 AM  Group Topic/Focus:  Goals Group:   The focus of this group is to help patients establish daily goals to achieve during treatment and discuss how the patient can incorporate goal setting into their daily lives to aide in recovery.  Participation Level:  Active  Participation Quality:  Sharing  Affect:  Appropriate  Cognitive:  Alert  Insight: Appropriate  Engagement in Group:  None  Modes of Intervention:  Clarification  Additional Comments:  PT did not have a realistic goal just wants to get through the  But not sure how.  Dub Mikes 06/13/2018, 9:05 AM

## 2018-06-13 NOTE — Progress Notes (Signed)
Recreation Therapy Notes  Date: 11.14.19 Time: 1000 Location: 500 Hall Dayroom  Group Topic: Anger Management  Goal Area(s) Addresses:  Patient will identify situations that lead to anger. Patient will identify ways in which they act because of anger. Patient will identify problems they have encountered because of anger.  Behavioral Response: Minimal  Intervention:  Worksheet  Activity:  Introduction to Anger.  Patients were to identify three situations that get them angry, ways in which they react to anger and any problems they encountered because of anger.  Education: Anger Management, Discharge Planning   Education Outcome: Acknowledges education/In group clarification offered/Needs additional education.   Clinical Observations/Feedback: Patient expressed she gets angry when she is accused of things.  Pt explained she prays, tries to find solutions and get to the root of the problem when angry.  Pt explained she hasn't run into any problems because of anger.     Victorino Sparrow, LRT/CTRS      Ria Comment, Amberlynn Tempesta A 06/13/2018 11:12 AM

## 2018-06-13 NOTE — BHH Group Notes (Addendum)
LCSW Group Therapy Note  06/12/2018 1:15 PM  Type of Therapy/Topic:  Group Therapy:  Emotion Regulation  Participation Level:  Active   Description of Group:   The purpose of this group is to assist patients in learning to regulate negative emotions and experience positive emotions. Patients will be guided to discuss ways in which they have been vulnerable to their negative emotions. These vulnerabilities will be juxtaposed with experiences of positive emotions or situations, and patients will be challenged to use positive emotions to combat negative ones. Special emphasis will be placed on coping with negative emotions in conflict situations, and patients will process healthy conflict resolution skills.  Therapeutic Goals: 1. Patient will identify two positive emotions or experiences to reflect on in order to balance out   negative emotions 2. Patient will label two or more emotions that they find the most difficult to experience 3. Patient will demonstrate positive conflict resolution skills through discussion and/or role plays  Summary of Patient Progress:  Barbara "Maudie Mercury" attended the entire group. Her affect was depressed/sad with limited insight regarding emotional regulation. [name] described prayer as a strategy that she uses when she has difficulty with emotions.   Therapeutic Modalities:   Cognitive Behavioral Therapy Feelings Identification Dialectical Behavioral Therapy   [name] MSW Intern 06/13/2018 10:19 AM

## 2018-06-13 NOTE — Progress Notes (Signed)
D:  Icel was up and visible on the unit.  She reported her day was ok.  She remains paranoid, anxious and fearful.  She approached RN stating "I don't feel safe in my room and want a staff member with me so people won't accuse me of things."  She has not taken a shower since being on the unit because now she has a roommate.  RN did sit in the room while she took a shower to make her feel safe.  She declined needing sleeping pill this evening.  Talked with her about the medication that was ordered today and she did agree to take them tomorrow.  She denied SI/HI or A/V hallucinations.  She denied any pain or discomfort and appeared to be in no physical distress.  She did state she was worried about her blood sugar because "I am not taking what I normally take at home."  Message left for MD to possibly order a diabetic consult.  She is currently resting with her eyes closed and appeared to be in no physical distress. A:  1:1 with RN for support and encouragement.  Medications as ordered.  Q 15 minute checks maintained for safety.  Encouraged participation in group and unit activities.   R:  Genae remains safe on the unit.  We will continue to monitor the progress towards her goals.

## 2018-06-13 NOTE — BHH Group Notes (Signed)
[place] Group Notes:  (Nursing/MHT/Case Management/Adjunct)  Date:  06/13/2018  Time:  4:55 PM  Type of Therapy:  Psychoeducational Skills  Participation Level:  Minimal  Participation Quality:  Appropriate  Affect:  Flat  Cognitive:  Alert  Insight:  None  Engagement in Group:  None  Modes of Intervention:  Discussion  Summary of Progress/Problems: Discussed crisis management.  Patient was attentive but did not participate.  Barbara Forbes 06/13/2018, 4:55 PM
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
## 2018-06-13 NOTE — Progress Notes (Signed)
D: Pt denies SI/HI/AVH. Pt is pleasant and cooperative. Pt very paranoid on the unit this evening, pt did not want to talk with writer this evening . Pt was paranoid that someone would hear her, even though there was no one close enough to hear our conversation. Pt stayed in her room most of the evening.   A: Pt was offered support and encouragement. Pt was given scheduled medications. Pt was encourage to attend groups. Q 15 minute checks were done for safety.   R: safety maintained on unit.  Problem: Self-Concept: Goal: Level of anxiety will decrease Outcome: Progressing   Problem: Coping: Goal: Coping ability will improve Outcome: Progressing   Problem: Activity: Goal: Sleeping patterns will improve Outcome: Progressing

## 2018-06-14 LAB — GLUCOSE, CAPILLARY
GLUCOSE-CAPILLARY: 175 mg/dL — AB (ref 70–99)
GLUCOSE-CAPILLARY: 181 mg/dL — AB (ref 70–99)
GLUCOSE-CAPILLARY: 258 mg/dL — AB (ref 70–99)
Glucose-Capillary: 145 mg/dL — ABNORMAL HIGH (ref 70–99)

## 2018-06-14 MED ORDER — ARIPIPRAZOLE 2 MG PO TABS
2.0000 mg | ORAL_TABLET | Freq: Every day | ORAL | Status: DC
  Administered 2018-06-15 – 2018-06-17 (×3): 2 mg via ORAL
  Filled 2018-06-14 (×5): qty 1

## 2018-06-14 MED ORDER — CLONAZEPAM 0.5 MG PO TABS
0.5000 mg | ORAL_TABLET | Freq: Three times a day (TID) | ORAL | Status: DC
  Administered 2018-06-14 – 2018-06-17 (×4): 0.5 mg via ORAL
  Filled 2018-06-14 (×5): qty 1

## 2018-06-14 MED ORDER — VORTIOXETINE HBR 10 MG PO TABS
10.0000 mg | ORAL_TABLET | Freq: Every day | ORAL | Status: DC
  Administered 2018-06-14 – 2018-06-17 (×3): 10 mg via ORAL
  Filled 2018-06-14 (×5): qty 1

## 2018-06-14 MED ORDER — HYDROXYZINE HCL 25 MG PO TABS
25.0000 mg | ORAL_TABLET | Freq: Three times a day (TID) | ORAL | Status: DC | PRN
  Administered 2018-06-14 – 2018-06-17 (×4): 25 mg via ORAL
  Filled 2018-06-14 (×3): qty 1

## 2018-06-14 NOTE — B[facility] Group Notes (Addendum)
Date/Time: 06/14/18, 1315  Group Therapy: Chaplain-Led Processing Group   Participation Level: minimal Participation Quality: limited Affect: flat Cognitive: limited Insight: limited Engagement in Therapy: attentive Modes of Intervention: Discussion, Socialization  Summary of Progress/Problems: Chaplain was here to lead a group on themes of hope and courage. Pt struggled to come up with a way to complete the sentence, "Hope is.." and she finally said "going home." Lurline Idol, LCSW

## 2018-06-14 NOTE — Progress Notes (Signed)
Recreation Therapy Notes  Date: 11.15.19 Time: 1000 Location:  [address] Dayroom  Group Topic: Communication, Team Building, Problem Solving  Goal Area(s) Addresses:  Patient will effectively work with peer towards shared goal.  Patient will identify skills used to make activity successful.  Patient will identify how skills used during activity can be used to reach post d/c goals.   Intervention: STEM Activity  Activity: Geophysicist/field seismologist. In teams patients were given 12 plastic drinking straws and a length of masking tape. Using the materials provided patients were asked to build a landing pad to catch a golf ball dropped from approximately 6 feet in the air.   Education: Education officer, community, Discharge Planning   Education Outcome: Acknowledges education/In group clarification offered/Needs additional education.   Clinical Observations/Feedback: Pt did not attend group.     Victorino Sparrow, LRT/CTRS         Victorino Sparrow A 06/14/2018 11:47 AM

## 2018-06-14 NOTE — Progress Notes (Signed)
[facility] MD Progress Note  06/14/2018 8:06 AM Barbara Forbes  MRN:  [id number] Subjective:   Patient reports sleeping fairly well she was hesitant to take some of her medications until they were fully explained to her not really reflecting paranoia but rather simply wanting more information Discussed her rumination about past issues and encouraged to focus on current issues with her husband.  States she has had thoughts of harming herself but states she will not hurt herself here therefore can contract here. Principal Problem: MDD (major depressive disorder), recurrent, severe, with psychosis ([place]) Diagnosis:   Patient Active Problem List   Diagnosis Date Noted  . MDD (major depressive disorder), recurrent, severe, with psychosis ([place]) [F33.3] 06/11/2018  . 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 Thyroiditis, lymphocytic [E06.3] 10/30/2013  . Chest pain [R07.9] 01/23/2012  . Palpitations [R00.2] 01/23/2012   Total Time spent with patient: 20 minutes  Past Medical History:  Past Medical History:  Diagnosis Date  . Arthritis   . Diabetes mellitus    since age 44  . Diabetic neuropathy ([place])   . Fatigue   . Head ache   . Hyperlipidemia   . Hypertension     Past Surgical History:  Procedure Laterality Date  . ABDOMINAL HYSTERECTOMY  2004  . CESAREAN SECTION  1989   Family History:  Family History  Problem Relation Age of Onset  . Stroke Mother   . Lupus Mother   . Cancer Father        Lung/brain  . Coronary artery disease Father    Family Psychiatric  History: no change Social History:  Social History   Substance and Sexual Activity  Alcohol Use No     Social History   Substance and Sexual Activity  Drug Use No    Social History   Socioeconomic History  . Marital status: Married    Spouse name: Not on file  . Number of children: 1  . Years of education: Not on file  . Highest education level: Not on file  Occupational History   . 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 Financial resource strain: Not on file  . Food insecurity:    Worry: Not on file    Inability: Not on file  . Transportation needs:    Medical: Not on file    Non-medical: Not on file  Tobacco Use  . Smoking status: Never Smoker  . Smokeless tobacco: Never Used  Substance and Sexual Activity  . Alcohol use: No  . Drug use: No  . Sexual activity: Yes    Birth control/protection: Surgical  Lifestyle  . Physical activity:    Days per week: Not on file    Minutes per session: Not on file  . Stress: Not on file  Relationships  . Social connections:    Talks on phone: Not on file    Gets together: Not on file    Attends religious service: Not on file    Active member of club or organization: Not on file    Attends meetings of clubs or organizations: Not on file    Relationship status: Not on file  Other Topics Concern  . Not on file  Social History Narrative  . Not on file   Additional Social History:  Sleep: Good  Appetite:  Good  Current Medications: Current Facility-Administered Medications  Medication Dose Route Frequency Provider Last Rate Last Dose  . acetaminophen (TYLENOL) tablet 650 mg  650 mg Oral Q6H PRN Suella Broad, FNP      . alum & mag hydroxide-simeth (MAALOX/MYLANTA) 200-200-20 MG/5ML suspension 30 mL  30 mL Oral Q4H PRN Burt Ek, Gayland Curry, FNP      . [START ON 06/15/2018] ARIPiprazole (ABILIFY) tablet 2 mg  2 mg Oral Daily Johnn Hai, MD      . clonazePAM [place]bye [place]) tablet 0.5 mg  0.5 mg Oral TID Johnn Hai, MD      . hydrOXYzine (ATARAX/VISTARIL) tablet 25 mg  25 mg Oral TID PRN Johnn Hai, MD      . insulin aspart (novoLOG) injection 0-9 Units  0-9 Units Subcutaneous TID WC Rozetta Nunnery, NP   1 Units at 06/14/18 [phone]  . insulin glargine (LANTUS) injection 22 Units  22 Units Subcutaneous  Daily Cobos, Myer Peer, MD   22 Units at 06/13/18 0859  . levothyroxine (SYNTHROID, LEVOTHROID) tablet 88 mcg  88 mcg Oral QAC breakfast Suella Broad, FNP   88 mcg at 06/14/18 1540  . losartan (COZAAR) tablet 100 mg  100 mg Oral Daily Suella Broad, FNP   100 mg at 06/14/18 0805  . magnesium hydroxide (MILK OF MAGNESIA) suspension 30 mL  30 mL Oral Daily PRN Suella Broad, FNP      . pravastatin (PRAVACHOL) tablet 80 mg  80 mg Oral Daily Suella Broad, FNP   80 mg at 06/14/18 0806  . traZODone (DESYREL) tablet 50 mg  50 mg Oral QHS PRN Suella Broad, FNP   50 mg at 06/13/18 2148  . vortioxetine HBr (TRINTELLIX) tablet 10 mg  10 mg Oral Daily Johnn Hai, MD        Lab Results:  Results for orders placed or performed during the hospital encounter of 06/11/18 (from the past 48 hour(s))  Glucose, capillary     Status: Abnormal   Collection Time: 06/12/18 12:05 PM  Result Value Ref Range   Glucose-Capillary 216 (H) 70 - 99 mg/dL  Glucose, capillary     Status: Abnormal   Collection Time: 06/12/18  5:07 PM  Result Value Ref Range   Glucose-Capillary 234 (H) 70 - 99 mg/dL  Glucose, capillary     Status: Abnormal   Collection Time: 06/12/18  8:17 PM  Result Value Ref Range   Glucose-Capillary 247 (H) 70 - 99 mg/dL  Glucose, capillary     Status: Abnormal   Collection Time: 06/13/18  6:38 AM  Result Value Ref Range   Glucose-Capillary 189 (H) 70 - 99 mg/dL  Glucose, capillary     Status: Abnormal   Collection Time: 06/13/18 11:58 AM  Result Value Ref Range   Glucose-Capillary 221 (H) 70 - 99 mg/dL  Glucose, capillary     Status: Abnormal   Collection Time: 06/13/18  4:56 PM  Result Value Ref Range   Glucose-Capillary 108 (H) 70 - 99 mg/dL  Glucose, capillary     Status: Abnormal   Collection Time: 06/13/18  8:42 PM  Result Value Ref Range   Glucose-Capillary 159 (H) 70 - 99 mg/dL  Glucose, capillary     Status: Abnormal   Collection  Time: 06/14/18  6:21 AM  Result Value Ref Range   Glucose-Capillary 145 (H) 70 - 99 mg/dL    Blood Alcohol level:  Lab Results  Component  Value Date   ETH <10 69/67/8938    Metabolic Disorder Labs: No results found for: HGBA1C, MPG No results found for: PROLACTIN No results found for: CHOL, TRIG, HDL, CHOLHDL, VLDL, LDLCALC  Physical Findings: AIMS:  , ,  ,  ,    CIWA:    COWS:     Musculoskeletal: Strength & Muscle Tone: within normal limits Gait & Station: normal Patient leans: N/A  Psychiatric Specialty Exam: Physical Exam  ROS  Blood pressure 126/77, pulse (!) 121, temperature (!) 97.3 F (36.3 C), temperature source Oral, resp. rate 18, height 5' 1.5" (1.562 m), weight 69.4 kg.Body mass index is 28.44 kg/m.  General Appearance: Fairly Groomed  Eye Contact:  Fair  Speech:  Slow  Volume:  Decreased  Mood:  Depressed  Affect:  Constricted  Thought Process:  Coherent  Orientation:  Other:  full  Thought Content:  Paranoid Ideation  Suicidal Thoughts:  Yes.  without intent/plan  Homicidal Thoughts:  No  Memory:  Immediate;   Good  Judgement:  Good  Insight:  Good  Psychomotor Activity:  Normal  Concentration:  Concentration: Good  Recall:  Good  Fund of Knowledge:  Good  Language:  Good  Akathisia:  Negative  Handed:  Right  AIMS (if indicated):     Assets:  Communication Skills  ADL's:  Intact  Cognition:  WNL  Sleep:  Number of Hours: 6.75     Treatment Plan Summary: Daily contact with patient to assess and evaluate symptoms and progress in treatment  Meds adjusted discussed in detail  Castin Donaghue, MD 06/14/2018, 8:06 AM

## 2018-06-14 NOTE — Progress Notes (Signed)
D: Pt denies SI/HI/AVH. Pt is pleasant and cooperative. Pt continues to be paranoid, but pt appeared to be progressing, pt took her sleep medication this evening after refusing it last night. Pt stated she felt the "same" as yesterday , but appears hopeful about the days upcoming.  A: Pt was offered support and encouragement. Pt was given scheduled medications. Pt was encourage to attend groups. Q 15 minute checks were done for safety.  R: Pt is taking medication. Pt has no complaints.Pt receptive to treatment and safety maintained on unit.  Problem: Self-Concept: Goal: Level of anxiety will decrease Outcome: Progressing   Problem: Coping: Goal: Coping ability will improve Outcome: Progressing

## 2018-06-14 NOTE — Plan of Care (Signed)
Problem: Coping: Goal: Ability to verbalize frustrations and anger appropriately will improve Outcome: Progressing   Problem: Safety: Goal: Periods of time without injury will increase Outcome: Progressing   Problem: Activity: Goal: Will verbalize the importance of balancing activity with adequate rest periods Outcome: Progressing DAR Notes: Pt A & O X4. Presents with flat affect, depressed mood and tearful when engaged. Remains suspicious and paranoid about her care including medications "I want printouts about all the medicines, I don't even trust the water I'm getting honey". Pt reports fair sleep with fair appetite, low energy and average concentration level. Pt rates her anxiety, depression and hopelessness all 8/10. Pt's goal for today "working on going back home". All medications given per MD's orders with verbal education and effects monitored. Emotional support and encouragement offered to pt throughout this shift. Q 15 minutes safety checks maintained without self harm gestures or outburst thus far.  Pt tolerates all PO intake well. Went off unit for meals without issues. Cooperative with care and unit routines. POC continues for safety and mood stability.

## 2018-06-15 LAB — GLUCOSE, CAPILLARY
GLUCOSE-CAPILLARY: 159 mg/dL — AB (ref 70–99)
GLUCOSE-CAPILLARY: 180 mg/dL — AB (ref 70–99)
Glucose-Capillary: 149 mg/dL — ABNORMAL HIGH (ref 70–99)

## 2018-06-15 MED ORDER — FAMOTIDINE 20 MG PO TABS
20.0000 mg | ORAL_TABLET | Freq: Two times a day (BID) | ORAL | Status: DC
  Administered 2018-06-15 – 2018-06-17 (×5): 20 mg via ORAL
  Filled 2018-06-15 (×9): qty 1

## 2018-06-15 NOTE — [facility] Group Notes (Signed)
  [facility] LCSW Group Therapy Note  Date/Time:  06/15/2018 11:15AM-12:00PM  Type of Therapy and Topic:  Group Therapy:  Feelings About Hospitalization  Participation Level:  Active   Description of Group This process group involved patients discussing their feelings related to being hospitalized, as well as the benefits they see to being in the hospital.  These feelings and benefits were itemized.  The group then brainstormed specific ways in which they could seek those same benefits when they discharge and return home.  Therapeutic Goals 1. Patient will identify and describe positive and negative feelings related to hospitalization 2. Patient will verbalize benefits of hospitalization to themselves personally 3. Patients will brainstorm together ways they can obtain similar benefits in the outpatient setting, identify barriers to wellness and possible solutions  Summary of Patient Progress:  The patient expressed her primary feelings about being hospitalized are that she is ready to go home and does not need to be in the hospital, needs to go back to work, feel the way she felt before, and "return to my old life from 2-3 weeks ago."  Her affect was blunted and she was not open to suggestions, although when CSW shared that the weekday staff will make follow-up appointments prior to her discharge, and that it will be important to go to her aftercare appointments if she is to stay on the medications and remain stable, she did agree that she will go to follow-up.  Therapeutic Modalities Cognitive Behavioral Therapy Motivational Interviewing    Selmer Dominion, LCSW 06/15/2018, 1:20 PM

## 2018-06-15 NOTE — Progress Notes (Signed)
D: Pt denies SI/HI/AVH. Pt is pleasant and cooperative. Pt continues to be paranoid on the unit and very suspicious of staff and peers . Pt stated she felt the same as when she came in. Pt refused her Klonopin earlier due to it making her sleepy. Pt did endorse anxiety " I bout had a nervous breakdown today" pt would not elaborate to Probation officer. Pt stated her lunch was better today due to it being less noise and her being able to enjoy her meal.   A: Pt was offered support and encouragement. Pt was given scheduled medications. Pt was encourage to attend groups. Q 15 minute checks were done for safety.  R:Pt is taking medication. Pt has no complaints.Pt receptive to treatment and safety maintained on unit.  Problem: Education: Goal: Ability to state activities that reduce stress will improve Outcome: Progressing   Problem: Self-Concept: Goal: Level of anxiety will decrease Outcome: Not Progressing   Problem: Coping: Goal: Coping ability will improve Outcome: Progressing   Problem: Medication: Goal: Compliance with prescribed medication regimen will improve Outcome: Progressing

## 2018-06-15 NOTE — Plan of Care (Signed)
  Problem: Self-Concept: Goal: Level of anxiety will decrease Outcome: Progressing   Problem: Safety: Goal: Periods of time without injury will increase Outcome: Progressing  DAR NOTE: Patient presents with anxious affect and depressed mood.  Denies suicidal thoughts, auditory and visual hallucinations.  Rates depression at 8, hopelessness at 8, and anxiety at 8.  Maintained on routine safety checks.  Medications given as prescribed.  Support and encouragement offered as needed.  Attended group and participated.  States goal for today is "hope of going home."  Patient visible in milieu with minimal interactions.  Offered no complaint.

## 2018-06-15 NOTE — Progress Notes (Signed)
Adult Psychoeducational Group Note  Date:  06/15/2018 Time:  11:26 PM  Group Topic/Focus:  Wrap-Up Group:   The focus of this group is to help patients review their daily goal of treatment and discuss progress on daily workbooks.  Participation Level:  Minimal  Participation Quality:  Appropriate  Affect:  Depressed, Flat and Tearful  Cognitive:  Confused  Insight: Lacking and Limited  Engagement in Group:  Lacking, Limited, Poor and Resistant  Modes of Intervention:  Discussion  Additional Comments:  Pt stated her goal for today was to get discharged. Pt stated she hasn't meet with her doctor about her discharged plan. Pt rated her over all day a 3 out of 10. Pt stated she attend all groups held today. Pt stated the visitation by her family help improve her day but over all she was very emotional today.  Candy Sledge 06/15/2018, 11:26 PM

## 2018-06-15 NOTE — Progress Notes (Signed)
[facility] MD Progress Note  06/15/2018 7:00 AM LISET MCMONIGLE  MRN:  [id number] Subjective:   Patient has a better understanding of her medication to this point she is fully compliant she states she is doing a little better when asked if she is still ruminating and generally torturing herself with her past misdeeds or at least perception of misdeeds she acknowledges she still doing this and continues to need some cognitive therapy to break this cycle.  She continues to acknowledge wishing she were not here but no plans or intent to harm herself while here.  Can contract while here Principal Problem: MDD (major depressive disorder), recurrent, severe, with psychosis ([place]) Diagnosis: Pression severe with psychosis Patient Active Problem List   Diagnosis Date Noted  . MDD (major depressive disorder), recurrent, severe, with psychosis ([place]) [F33.3] 06/11/2018  . 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 Thyroiditis, lymphocytic [E06.3] 10/30/2013  . Chest pain [R07.9] 01/23/2012  . Palpitations [R00.2] 01/23/2012   Total Time spent with patient: 20 minutes  Past Psychiatric History: no new data shared  Past Medical History:  Past Medical History:  Diagnosis Date  . Arthritis   . Diabetes mellitus    since age 47  . Diabetic neuropathy ([place])   . Fatigue   . Head ache   . Hyperlipidemia   . Hypertension     Past Surgical History:  Procedure Laterality Date  . ABDOMINAL HYSTERECTOMY  2004  . CESAREAN SECTION  1989   Family History:  Family History  Problem Relation Age of Onset  . Stroke Mother   . Lupus Mother   . Cancer Father        Lung/brain  . Coronary artery disease Father    Family Psychiatric  History: no new data Social History:  Social History   Substance and Sexual Activity  Alcohol Use No     Social History   Substance and Sexual Activity  Drug Use No    Social History   Socioeconomic History  . Marital status: Married   Spouse name: Not on file  . Number of children: 1  . Years of education: Not on file  . Highest education level: Not on file  Occupational History  . 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 Financial resource strain: Not on file  . Food insecurity:    Worry: Not on file    Inability: Not on file  . Transportation needs:    Medical: Not on file    Non-medical: Not on file  Tobacco Use  . Smoking status: Never Smoker  . Smokeless tobacco: Never Used  Substance and Sexual Activity  . Alcohol use: No  . Drug use: No  . Sexual activity: Yes    Birth control/protection: Surgical  Lifestyle  . Physical activity:    Days per week: Not on file    Minutes per session: Not on file  . Stress: Not on file  Relationships  . Social connections:    Talks on phone: Not on file    Gets together: Not on file    Attends religious service: Not on file    Active member of club or organization: Not on file    Attends meetings of clubs or organizations: Not on file    Relationship status: Not on file  Other Topics Concern  . Not on file  Social History Narrative  . Not on file  Additional Social History:                         Sleep: Good  Appetite:  Good  Current Medications: Current Facility-Administered Medications  Medication Dose Route Frequency Provider Last Rate Last Dose  . acetaminophen (TYLENOL) tablet 650 mg  650 mg Oral Q6H PRN Suella Broad, FNP      . alum & mag hydroxide-simeth (MAALOX/MYLANTA) 200-200-20 MG/5ML suspension 30 mL  30 mL Oral Q4H PRN Suella Broad, FNP   30 mL at 06/14/18 2044  . ARIPiprazole (ABILIFY) tablet 2 mg  2 mg Oral Daily Johnn Hai, MD      . clonazePAM Bobbye [place]) tablet 0.5 mg  0.5 mg Oral TID Johnn Hai, MD   0.5 mg at 06/14/18 1653  . famotidine (PEPCID) tablet 20 mg  20 mg Oral BID Johnn Hai, MD      . hydrOXYzine (ATARAX/VISTARIL) tablet 25  mg  25 mg Oral TID PRN Johnn Hai, MD   25 mg at 06/14/18 2041  . insulin aspart (novoLOG) injection 0-9 Units  0-9 Units Subcutaneous TID WC Lindon Romp A, NP   1 Units at 06/15/18 [phone]  . insulin glargine (LANTUS) injection 22 Units  22 Units Subcutaneous Daily Cobos, Myer Peer, MD   22 Units at 06/14/18 0809  . levothyroxine (SYNTHROID, LEVOTHROID) tablet 88 mcg  88 mcg Oral QAC breakfast Suella Broad, FNP   88 mcg at 06/15/18 8850  . losartan (COZAAR) tablet 100 mg  100 mg Oral Daily Suella Broad, FNP   100 mg at 06/14/18 0805  . magnesium hydroxide (MILK OF MAGNESIA) suspension 30 mL  30 mL Oral Daily PRN Suella Broad, FNP      . pravastatin (PRAVACHOL) tablet 80 mg  80 mg Oral Daily Suella Broad, FNP   80 mg at 06/14/18 0806  . traZODone (DESYREL) tablet 50 mg  50 mg Oral QHS PRN Suella Broad, FNP   50 mg at 06/14/18 2041  . vortioxetine HBr (TRINTELLIX) tablet 10 mg  10 mg Oral Daily Johnn Hai, MD   10 mg at 06/14/18 0900    Lab Results:  Results for orders placed or performed during the hospital encounter of 06/11/18 (from the past 48 hour(s))  Glucose, capillary     Status: Abnormal   Collection Time: 06/13/18 11:58 AM  Result Value Ref Range   Glucose-Capillary 221 (H) 70 - 99 mg/dL  Glucose, capillary     Status: Abnormal   Collection Time: 06/13/18  4:56 PM  Result Value Ref Range   Glucose-Capillary 108 (H) 70 - 99 mg/dL  Glucose, capillary     Status: Abnormal   Collection Time: 06/13/18  8:42 PM  Result Value Ref Range   Glucose-Capillary 159 (H) 70 - 99 mg/dL  Glucose, capillary     Status: Abnormal   Collection Time: 06/14/18  6:21 AM  Result Value Ref Range   Glucose-Capillary 145 (H) 70 - 99 mg/dL  Glucose, capillary     Status: Abnormal   Collection Time: 06/14/18 12:01 PM  Result Value Ref Range   Glucose-Capillary 175 (H) 70 - 99 mg/dL  Glucose, capillary     Status: Abnormal   Collection Time: 06/14/18   4:52 PM  Result Value Ref Range   Glucose-Capillary 181 (H) 70 - 99 mg/dL  Glucose, capillary     Status: Abnormal   Collection Time: 06/14/18  8:39 PM  Result Value Ref Range   Glucose-Capillary 258 (H) 70 - 99 mg/dL  Glucose, capillary     Status: Abnormal   Collection Time: 06/15/18  6:28 AM  Result Value Ref Range   Glucose-Capillary 149 (H) 70 - 99 mg/dL    Blood Alcohol level:  Lab Results  Component Value Date   ETH <10 91/66/0600    Metabolic Disorder Labs: No results found for: HGBA1C, MPG No results found for: PROLACTIN No results found for: CHOL, TRIG, HDL, CHOLHDL, VLDL, LDLCALC  Physical Findings: AIMS:  , ,  ,  ,    CIWA:    COWS:     Musculoskeletal: Strength & Muscle Tone: within normal limits Gait & Station: normal Patient leans: Right  Psychiatric Specialty Exam: Physical Exam  ROS  Blood pressure 112/65, pulse (!) 111, temperature 98.5 F (36.9 C), temperature source Oral, resp. rate 18, height 5' 1.5" (1.562 m), weight 69.4 kg.Body mass index is 28.44 kg/m.  General Appearance: Fairly Groomed  Eye Contact:  Good  Speech:  Clear and Coherent  Volume:  Decreased  Mood:  Anxious and Depressed  Affect:  Restricted  Thought Process:  Coherent  Orientation:  Full (Time, Place, and Person)  Thought Content:  Rumination and Tangential  Suicidal Thoughts:  No  Homicidal Thoughts:  No  Memory:  Immediate;   Good  Judgement:  Good  Insight:  Good  Psychomotor Activity:  Decreased  Concentration:  Concentration: Fair  Recall:  [place] of Knowledge:  Fair  Language:  Fair  Akathisia:  Negative  Handed:  Right  AIMS (if indicated):     Assets:  Communication Skills  ADL's:  Intact  Cognition:  WNL  Sleep:  Number of Hours: 6.75   Paranoia seems to have dissipated at this point much less guarded and fearful Treatment Plan Summary: Daily contact with patient to assess and evaluate symptoms and progress in treatment, Medication management and  Plan Asks for Pepcid no other med changes  Naryiah Schley, MD 06/15/2018, 7:00 AM

## 2018-06-16 LAB — GLUCOSE, CAPILLARY
GLUCOSE-CAPILLARY: 85 mg/dL (ref 70–99)
GLUCOSE-CAPILLARY: 230 mg/dL — AB (ref 70–99)
GLUCOSE-CAPILLARY: 125 mg/dL — AB (ref 70–99)
Glucose-Capillary: 203 mg/dL — ABNORMAL HIGH (ref 70–99)

## 2018-06-16 NOTE — Progress Notes (Signed)
Nursing Progress Note: 7p-7a D: Pt currently presents with a anxious/sad/circumstantial affect and behavior. Pt states "I just want to sleep. I feel so sad." Interacting minimally with the milieu. Pt reports good sleep during the previous night with current medication regimen. Pt did not attend wrap-up group.  A: Pt provided with medications per providers orders. Pt's labs and vitals were monitored throughout the night. Pt supported emotionally and encouraged to express concerns and questions. Pt educated on medications.  R: Pt's safety ensured with 15 minute and environmental checks. Pt currently denies SI, HI, and AVH. Pt verbally contracts to seek staff if SI,HI, or AVH occurs and to consult with staff before acting on any harmful thoughts. Will continue to monitor.

## 2018-06-16 NOTE — B[facility] Group Notes (Signed)
[facility] LCSW Group Therapy Note  Date/Time:  06/16/2018  11:00AM-12:00PM  Type of Therapy and Topic:  Group Therapy:  Music and Mood  Participation Level:  Active   Description of Group: In this process group, members listened to a variety of genres of music and identified that different types of music evoke different responses.  Patients were encouraged to identify music that was soothing for them and music that was energizing for them.  Patients discussed how this knowledge can help with wellness and recovery in various ways including managing depression and anxiety as well as encouraging healthy sleep habits.    Therapeutic Goals: 1. Patients will explore the impact of different varieties of music on mood 2. Patients will verbalize the thoughts they have when listening to different types of music 3. Patients will identify music that is soothing to them as well as music that is energizing to them 4. Patients will discuss how to use this knowledge to assist in maintaining wellness and recovery 5. Patients will explore the use of music as a coping skill  Summary of Patient Progress:  At the beginning of group, patient expressed nothing about how she felt, but she had an anxious and depressed affect.  This did not change during group, even when a song she knew and which type she had requested was played.  At the end of group she did not address how the music during this particular group had made her feel, but said in general music helps her to feel better.  Therapeutic Modalities: Solution Focused Brief Therapy Activity   Selmer Dominion, LCSW

## 2018-06-16 NOTE — Progress Notes (Signed)
[facility] MD Progress Note  06/16/2018 9:22 AM Barbara Forbes  MRN:  [id number] Subjective:    Patient seems a little more guarded today she believes her depression is lifting she states she still has some paranoid thinking but will not elaborate in general she is improved but does not feel that she is 100% she is still eager to go home though we discussed possibly tomorrow but she does not have suicidal thoughts today can contract here hopefully can contract fully and paranoia may be of less intensity by tomorrow Principal Problem: MDD (major depressive disorder), recurrent, severe, with psychosis ([place]) Diagnosis:   Patient Active Problem List   Diagnosis Date Noted  . MDD (major depressive disorder), recurrent, severe, with psychosis ([place]) [F33.3] 06/11/2018  . 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 Thyroiditis, lymphocytic [E06.3] 10/30/2013  . Chest pain [R07.9] 01/23/2012  . Palpitations [R00.2] 01/23/2012   Total Time spent with patient: 20 minutes  Past Medical History:  Past Medical History:  Diagnosis Date  . Arthritis   . Diabetes mellitus    since age 79  . Diabetic neuropathy ([place])   . Fatigue   . Head ache   . Hyperlipidemia   . Hypertension     Past Surgical History:  Procedure Laterality Date  . ABDOMINAL HYSTERECTOMY  2004  . CESAREAN SECTION  1989   Family History:  Family History  Problem Relation Age of Onset  . Stroke Mother   . Lupus Mother   . Cancer Father        Lung/brain  . Coronary artery disease Father    Family Psychiatric  History: no change in data Social History:  Social History   Substance and Sexual Activity  Alcohol Use No     Social History   Substance and Sexual Activity  Drug Use No    Social History   Socioeconomic History  . Marital status: Married    Spouse name: Not on file  . Number of children: 1  . Years of education: Not on file  . Highest education level: Not on file   Occupational History  . 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 Financial resource strain: Not on file  . Food insecurity:    Worry: Not on file    Inability: Not on file  . Transportation needs:    Medical: Not on file    Non-medical: Not on file  Tobacco Use  . Smoking status: Never Smoker  . Smokeless tobacco: Never Used  Substance and Sexual Activity  . Alcohol use: No  . Drug use: No  . Sexual activity: Yes    Birth control/protection: Surgical  Lifestyle  . Physical activity:    Days per week: Not on file    Minutes per session: Not on file  . Stress: Not on file  Relationships  . Social connections:    Talks on phone: Not on file    Gets together: Not on file    Attends religious service: Not on file    Active member of club or organization: Not on file    Attends meetings of clubs or organizations: Not on file    Relationship status: Not on file  Other Topics Concern  . Not on file  Social History Narrative  . Not on file   Additional Social History:  Sleep: Good  Appetite:  Good  Current Medications: Current Facility-Administered Medications  Medication Dose Route Frequency Provider Last Rate Last Dose  . acetaminophen (TYLENOL) tablet 650 mg  650 mg Oral Q6H PRN Suella Broad, FNP      . alum & mag hydroxide-simeth (MAALOX/MYLANTA) 200-200-20 MG/5ML suspension 30 mL  30 mL Oral Q4H PRN Suella Broad, FNP   30 mL at 06/14/18 2044  . ARIPiprazole (ABILIFY) tablet 2 mg  2 mg Oral Daily Johnn Hai, MD   2 mg at 06/16/18 0805  . clonazePAM (KLONOPIN) tablet 0.5 mg  0.5 mg Oral TID Johnn Hai, MD   0.5 mg at 06/15/18 0834  . famotidine (PEPCID) tablet 20 mg  20 mg Oral BID Johnn Hai, MD   20 mg at 06/16/18 0805  . hydrOXYzine (ATARAX/VISTARIL) tablet 25 mg  25 mg Oral TID PRN Johnn Hai, MD   25 mg at 06/15/18 2142  . insulin aspart  (novoLOG) injection 0-9 Units  0-9 Units Subcutaneous TID WC Lindon Romp A, NP   2 Units at 06/15/18 1658  . insulin glargine (LANTUS) injection 22 Units  22 Units Subcutaneous Daily Cobos, Myer Peer, MD   22 Units at 06/16/18 [phone]  . levothyroxine (SYNTHROID, LEVOTHROID) tablet 88 mcg  88 mcg Oral QAC breakfast Suella Broad, FNP   88 mcg at 06/16/18 0641  . losartan (COZAAR) tablet 100 mg  100 mg Oral Daily Suella Broad, FNP   100 mg at 06/16/18 0805  . magnesium hydroxide (MILK OF MAGNESIA) suspension 30 mL  30 mL Oral Daily PRN Starkes-Perry, Gayland Curry, FNP      . pravastatin (PRAVACHOL) tablet 80 mg  80 mg Oral Daily Suella Broad, FNP   80 mg at 06/16/18 0805  . traZODone (DESYREL) tablet 50 mg  50 mg Oral QHS PRN Suella Broad, FNP   50 mg at 06/15/18 2142  . vortioxetine HBr (TRINTELLIX) tablet 10 mg  10 mg Oral Daily Johnn Hai, MD   10 mg at 06/15/18 2130    Lab Results:  Results for orders placed or performed during the hospital encounter of 06/11/18 (from the past 48 hour(s))  Glucose, capillary     Status: Abnormal   Collection Time: 06/14/18 12:01 PM  Result Value Ref Range   Glucose-Capillary 175 (H) 70 - 99 mg/dL  Glucose, capillary     Status: Abnormal   Collection Time: 06/14/18  4:52 PM  Result Value Ref Range   Glucose-Capillary 181 (H) 70 - 99 mg/dL  Glucose, capillary     Status: Abnormal   Collection Time: 06/14/18  8:39 PM  Result Value Ref Range   Glucose-Capillary 258 (H) 70 - 99 mg/dL  Glucose, capillary     Status: Abnormal   Collection Time: 06/15/18  6:28 AM  Result Value Ref Range   Glucose-Capillary 149 (H) 70 - 99 mg/dL  Glucose, capillary     Status: Abnormal   Collection Time: 06/15/18  4:56 PM  Result Value Ref Range   Glucose-Capillary 159 (H) 70 - 99 mg/dL  Glucose, capillary     Status: Abnormal   Collection Time: 06/15/18  8:10 PM  Result Value Ref Range   Glucose-Capillary 180 (H) 70 - 99 mg/dL   Glucose, capillary     Status: None   Collection Time: 06/16/18  6:11 AM  Result Value Ref Range   Glucose-Capillary 85 70 - 99 mg/dL    Blood Alcohol level:  Lab  Results  Component Value Date   ETH <10 44/81/8563    Metabolic Disorder Labs: No results found for: HGBA1C, MPG No results found for: PROLACTIN No results found for: CHOL, TRIG, HDL, CHOLHDL, VLDL, LDLCALC  Physical Findings: AIMS:  , ,  ,  ,    CIWA:    COWS:     Musculoskeletal: Strength & Muscle Tone: within normal limits Gait & Station: normal Patient leans: N/A  Psychiatric Specialty Exam: Physical Exam  ROS  Blood pressure 120/63, pulse (!) 105, temperature 98.7 F (37.1 C), temperature source Oral, resp. rate 18, height 5' 1.5" (1.562 m), weight 69.4 kg.Body mass index is 28.44 kg/m.  General Appearance: Casual  Eye Contact:  Fair  Speech:  Clear and Coherent  Volume:  Decreased  Mood:  Anxious and Depressed  Affect:  Flat  Thought Process:  Goal Directed  Orientation:  Full (Time, Place, and Person)  Thought Content:  Paranoid Ideation  Suicidal Thoughts:  No  Homicidal Thoughts:  No  Memory:  Immediate;   Good  Judgement:  Good  Insight:  Fair  Psychomotor Activity:  Normal  Concentration:  Concentration: Good  Recall:  Good  Fund of Knowledge:  Good  Language:  Good  Akathisia:  Negative  Handed:  Right  AIMS (if indicated):     Assets:  Communication Skills  ADL's:  Intact  Cognition:  WNL  Sleep:  Number of Hours: 6.75     Treatment Plan Summary: Daily contact with patient to assess and evaluate symptoms and progress in treatment and Medication management  Truitt Cruey, MD 06/16/2018, 9:22 AM

## 2018-06-16 NOTE — Progress Notes (Signed)
DAR NOTE: Patient presents with anxious affect and depressed mood. Pt asked she was doing, pt stated " you already , well am here that alone does not make me okay., " Pt also gave her cloths to another pt, pt was reminded of not giving personal belongs out. Pt reports good sleep, good appetite, low energy and poor concentration.  Denies pain, auditory and visual hallucinations.  Rates depression at 8, hopelessness at 8, and anxiety at 8.  Maintained on routine safety checks.  Medications given as prescribed.  Support and encouragement offered as needed. States goal for today is "gioing home."  Will continue to monitor.

## 2018-06-17 LAB — GLUCOSE, CAPILLARY
Glucose-Capillary: 300 mg/dL — ABNORMAL HIGH (ref 70–99)
Glucose-Capillary: 188 mg/dL — ABNORMAL HIGH (ref 70–99)

## 2018-06-17 MED ORDER — INSULIN ASPART 100 UNIT/ML ~~LOC~~ SOLN: 0.0000 [IU] | Freq: Three times a day (TID) | SUBCUTANEOUS | 11 refills | Status: AC

## 2018-06-17 MED ORDER — PRAVASTATIN SODIUM 80 MG PO TABS: 80.0000 mg | ORAL_TABLET | Freq: Every day | ORAL | Status: AC

## 2018-06-17 MED ORDER — CLONAZEPAM 0.5 MG PO TABS: 0.5000 mg | ORAL_TABLET | Freq: Three times a day (TID) | ORAL | 0 refills | Status: AC

## 2018-06-17 MED ORDER — TRAZODONE HCL 50 MG PO TABS: 50.0000 mg | ORAL_TABLET | Freq: Every evening | ORAL | 0 refills | Status: AC | PRN

## 2018-06-17 MED ORDER — INSULIN GLARGINE 100 UNIT/ML ~~LOC~~ SOLN: 22.0000 [IU] | Freq: Every day | SUBCUTANEOUS | 11 refills | Status: AC

## 2018-06-17 MED ORDER — HYDROXYZINE HCL 25 MG PO TABS: 25.0000 mg | ORAL_TABLET | Freq: Three times a day (TID) | ORAL | 0 refills | Status: AC | PRN

## 2018-06-17 MED ORDER — ARIPIPRAZOLE 2 MG PO TABS: 2.0000 mg | ORAL_TABLET | Freq: Every day | ORAL | 0 refills | Status: AC

## 2018-06-17 MED ORDER — SERTRALINE HCL 100 MG PO TABS: 100.0000 mg | ORAL_TABLET | Freq: Every day | ORAL | 0 refills | Status: DC

## 2018-06-17 MED ORDER — LEVOTHYROXINE SODIUM 88 MCG PO TABS: 88.0000 ug | ORAL_TABLET | Freq: Every day | ORAL | Status: AC

## 2018-06-17 MED ORDER — VORTIOXETINE HBR 20 MG PO TABS: 20.0000 mg | ORAL_TABLET | Freq: Every day | ORAL | 0 refills | Status: AC

## 2018-06-17 MED ORDER — VORTIOXETINE HBR 20 MG PO TABS
20.0000 mg | ORAL_TABLET | Freq: Every day | ORAL | Status: DC
  Filled 2018-06-17 (×2): qty 1

## 2018-06-17 MED ORDER — FAMOTIDINE 20 MG PO TABS: 20.0000 mg | ORAL_TABLET | Freq: Two times a day (BID) | ORAL | 0 refills | Status: AC

## 2018-06-17 MED ORDER — LOSARTAN POTASSIUM 100 MG PO TABS: 100.0000 mg | ORAL_TABLET | Freq: Every day | ORAL | Status: AC

## 2018-06-17 MED ORDER — INSULIN DEGLUDEC 100 UNIT/ML ~~LOC~~ SOPN: 22.0000 [IU] | PEN_INJECTOR | Freq: Every day | SUBCUTANEOUS | Status: AC

## 2018-06-17 NOTE — Progress Notes (Signed)
  [facility] Adult Case Management Discharge Plan :  Will you be returning to the same living situation after discharge:  Yes,  with husband At discharge, do you have transportation home?: Yes,  husband Do you have the ability to pay for your medications: Yes,  BCBS  Release of information consent forms completed and in the chart;  Patient's signature needed at discharge.  Patient to Follow up at: [facility]. Go on 06/19/2018.   Why:  Please attend your therapy appointment on Wednesday, 06/19/18 at 12:00p.m. Contact information: [address] [place] [address] phone: [phone] fax: [phone]       [facility]. Go on 06/28/2018.   Why:  Please attend your medication appt with Dr Sanjuana Letters on Friday, 06/28/18, at 2pm.  Contact information: [address] [place], [place] [address] P: [phone] F: [phone]          Next level of care provider has access to [place] and Suicide Prevention discussed: Yes,  with husband  Have you used any form of tobacco in the last 30 days? (Cigarettes, Smokeless Tobacco, Cigars, and/or Pipes): No  Has patient been referred to the Quitline?: N/A patient is not a smoker  Patient has been referred for addiction treatment: N/A  Joanne Chars, [place] 06/17/2018, 10:10 AM

## 2018-06-17 NOTE — BHH Suicide Risk Assessment (Signed)
[facility] Discharge Suicide Risk Assessment   Principal Problem: MDD (major depressive disorder), recurrent, severe, with psychosis ([place]) Discharge Diagnoses:  Patient Active Problem List   Diagnosis Date Noted  . MDD (major depressive disorder), recurrent, severe, with psychosis ([place]) [F33.3] 06/11/2018  . 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 Thyroiditis, lymphocytic [E06.3] 10/30/2013  . Chest pain [R07.9] 01/23/2012  . Palpitations [R00.2] 01/23/2012    Total Time spent with patient: 20 minutes  Musculoskeletal: Strength & Muscle Tone: within normal limits Gait & Station: normal Patient leans: n/a Psychiatric Specialty Exam: ROS  Blood pressure 116/67, pulse (!) 122, temperature 98.7 F (37.1 C), temperature source Oral, resp. rate 18, height 5' 1.5" (1.562 m), weight 69.4 kg.Body mass index is 28.44 kg/m.  General Appearance: Casual  Eye Contact::  Fair  Speech:  Clear and Coherent409  Volume:  Normal  Mood:  Euthymic  Affect:  Congruent  Thought Process:  Coherent  Orientation:  Full (Time, Place, and Person)  Thought Content:  Logical  Suicidal Thoughts:  No  Homicidal Thoughts:  No  Memory:  Immediate;   Good  Judgement:  NA  Insight:  Good  Psychomotor Activity:  Normal  Concentration:  Good  Recall:  Good  Fund of Knowledge:Good  Language: Good  Akathisia:  Negative  Handed:  Right  AIMS (if indicated):     Assets:  Communication Skills  Sleep:  Number of Hours: 6.75  Cognition: WNL  ADL's:  Intact   Mental Status Per Nursing Assessment::   On Admission:  NA  Demographic Factors:  Caucasian  Loss Factors: NA  Historical Factors: NA  Risk Reduction Factors:   NA  Continued Clinical Symptoms:  Depression:   Severe  Cognitive Features That Contribute To Risk:  None    Suicide Risk:  Minimal: No identifiable suicidal ideation.  Patients presenting with no risk factors but with morbid ruminations; may be  classified as minimal risk based on the severity of the depressive symptoms  Follow-up Information    [facility]. Go on 06/14/2018.   Why:  Please attend your medication management appointment on .... Contact information: [place] [address] phone: [phone] fax:        [facility]. Go on 06/19/2018.   Why:  Please attend your therapy appointment on Wednesday, 06/19/18 at 12:00p.m. Contact information: [address] [place] [address] phone: [phone] fax: [phone]          Plan Of Care/Follow-up recommendations:  Activity:  full  Angeline Trick, MD 06/17/2018, 8:55 AM

## 2018-06-17 NOTE — Progress Notes (Signed)
CSW attempted to have pt sign Release of Information forms for her therapist and for her referral to Dr Sanjuana Letters for medication management twice.  Pt declined to do so, said she "did not feel comfortable" and would share the needed information herself. Winferd Humphrey, MSW, LCSW Clinical Social Worker 06/17/2018 11:12 AM

## 2018-06-17 NOTE — Progress Notes (Signed)
Patient discharged to lobby. Patient was stable and appreciative at that time. All papers and prescriptions were given and valuables returned. Verbal understanding expressed. Denies SI/HI and A/VH. Patient given opportunity to express concerns and ask questions.  

## 2018-06-17 NOTE — Progress Notes (Signed)
Recreation Therapy Notes  INPATIENT RECREATION TR PLAN  Patient Details Name: Barbara Forbes MRN: [id number] DOB: 08-Sep-1960 Today's Date: 06/17/2018  Rec Therapy Plan Is patient appropriate for Therapeutic Recreation?: Yes Treatment times per week: about 3 days Estimated Length of Stay: 5-7 days TR Treatment/Interventions: Group participation (Comment)  Discharge Criteria Pt will be discharged from therapy if:: Discharged Treatment plan/goals/alternatives discussed and agreed upon by:: Patient/family  Discharge Summary Short term goals set: See patient care plan Short term goals met: Adequate for discharge Progress toward goals comments: Groups attended Which groups?: Anger management Reason goals not met: Pt attended one group session Therapeutic equipment acquired: N/A Reason patient discharged from therapy: Discharge from hospital Pt/family agrees with progress & goals achieved: Yes Date patient discharged from therapy: 06/17/18     Victorino Sparrow, LRT/CTRS  Ria Comment, Adrain Butrick A 06/17/2018, 11:00 AM

## 2018-06-17 NOTE — Plan of Care (Signed)
Pt attended one recreational therapy group session and was able to identify some coping skills.    Victorino Sparrow, LRT/CTRS

## 2018-06-17 NOTE — BHH Group Notes (Signed)
Adult Psychoeducational Group Note  Date:  06/17/2018 Time:  9:08 AM  Group Topic/Focus:  Goals Group:   The focus of this group is to help patients establish daily goals to achieve during treatment and discuss how the patient can incorporate goal setting into their daily lives to aide in recovery.  Participation Level:  Active  Participation Quality:  Appropriate  Affect:  Appropriate  Cognitive:  Appropriate  Insight: Appropriate  Engagement in Group:  Engaged  Modes of Intervention:  Education  Additional Comments:  Pt attended group and talked about the goal she wanted to work on for the day.  Barbara Forbes 06/17/2018, 9:08 AM

## 2018-06-17 NOTE — Progress Notes (Signed)
Recreation Therapy Notes  Date: 11.18.19 Time: 1000 Location: [address] Dayroom  Group Topic: Coping Skills  Goal Area(s) Addresses:  Patient will be able to identify positive coping skills. Patient will be able to identify benefits of using coping skills post d/c.  Intervention: Worksheet  Activity: Mind map.  LRT and patients filled in the first 8 boxes together (anxiety, depression, stress, loneliness, anger, deceitfulness, finances and change).  Patients were to fill in the remaining boxes individually with coping skills for each issue presented before reconvening as a group.  LRT would then write on the coping skills on the board with their respective issue.  Education: Radiographer, therapeutic, Dentist.   Education Outcome: Acknowledges understanding/In group clarification offered/Needs additional education.   Clinical Observations/Feedback:  Pt did not attend group.    Victorino Sparrow, LRT/CTRS         Victorino Sparrow A 06/17/2018 12:41 PM

## 2018-06-17 NOTE — Discharge Summary (Signed)
Physician Discharge Summary Note  Patient:  Barbara Forbes is an 57 y.o., female  MRN:  [id number]  DOB:  1960-08-24  Patient phone:  [phone] (home)   Patient address:   [place] [address],   Total Time spent with patient: Greater than 30 minutes  Date of Admission:  06/11/2018  Date of Discharge: 06-17-18  Reason for Admission: Odd behavior of 2 days, paranoia & suicidal threats.   Principal Problem: MDD (major depressive disorder), recurrent, severe, with psychosis [facility])  Discharge Diagnoses: Patient Active Problem List   Diagnosis Date Noted  . MDD (major depressive disorder), recurrent, severe, with psychosis ([place]) [F33.3] 06/11/2018    Priority: High  . 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 Thyroiditis, lymphocytic [E06.3] 10/30/2013  . Chest pain [R07.9] 01/23/2012  . Palpitations [R00.2] 01/23/2012   Past Psychiatric History: MDD with psychosis  Past Medical History:  Past Medical History:  Diagnosis Date  . Arthritis   . Diabetes mellitus    since age 89  . Diabetic neuropathy ([place])   . Fatigue   . Head ache   . Hyperlipidemia   . Hypertension     Past Surgical History:  Procedure Laterality Date  . ABDOMINAL HYSTERECTOMY  2004  . CESAREAN SECTION  1989   Family History:  Family History  Problem Relation Age of Onset  . Stroke Mother   . Lupus Mother   . Cancer Father        Lung/brain  . Coronary artery disease Father    Family Psychiatric  History: See H&P  Social History:  Social History   Substance and Sexual Activity  Alcohol Use No     Social History   Substance and Sexual Activity  Drug Use No    Social History   Socioeconomic History  . Marital status: Married    Spouse name: Not on file  . Number of children: 1  . Years of education: Not on file  . Highest education level: Not on file  Occupational History  . 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 Financial resource strain: Not on file  . Food insecurity:    Worry: Not on file    Inability: Not on file  . Transportation needs:    Medical: Not on file    Non-medical: Not on file  Tobacco Use  . Smoking status: Never Smoker  . Smokeless tobacco: Never Used  Substance and Sexual Activity  . Alcohol use: No  . Drug use: No  . Sexual activity: Yes    Birth control/protection: Surgical  Lifestyle  . Physical activity:    Days per week: Not on file    Minutes per session: Not on file  . Stress: Not on file  Relationships  . Social connections:    Talks on phone: Not on file    Gets together: Not on file    Attends religious service: Not on file    Active member of club or organization: Not on file    Attends meetings of clubs or organizations: Not on file    Relationship status: Not on file  Other Topics Concern  . Not on file  Social History Narrative  . Not on file   Hospital Course: (Per admission evaluation): This is the first psychiatric admission assessment for this 57 year old married Caucasian female. Admitted to the [facility] from the [facility]  ED with complaints of odd behavior of 2 days, paranoia & suicidal threats. She was brought to the hospital for psychiatric evaluation & possible treatment. During this assessment, [name] reports, "My husband took me to the hospital on Monday, 2 days ago. I did not want to go, but, he told me that I had no choice but to go. Some thing is going on in my head for the last 8 weeks. It is like someone is actively investigating me. So, I became highly sensitive, uncomfortable & paranoid to what people say around me & also I became sensitive to what was going on,  on TV. This has been going on x 8 weeks. I think the trigger was what I had done a long time ago. I stole money form my employer. Someone that I work with now knew about this my secret & had told other people about  it. Now that my secret is in the open, I'm afraid that I will be accused of other things too. I have been feeling very depressed & anxious for about 8 weeks now. I'm scared, worried & have not been sleeping at night for 2 weeks. I have no appetite. I slept well here last night. I was started on depression medicine called Zoloft about 2 weeks ago.  I took one or 2 tablets of it & stopped because I read on line that this medicine will cause you to have suicidal thoughts & paranoia. I'm afraid that my daughter will not be able to forgive me & my husband is suspecting me of doing other awful things as well".  After the above admission assessment, it was determined that Barbara Forbes will need medication management to re-stabilize her mood. The medications regimen for the presenting symptoms were discussed & initiated. She was medicated & discharged on; Abilify 2 mg for mood control, Klonopin 0.5 mg prn for anxiety, Vistaril 25 mg prn for anxiety, Trazodone 50 mg for prn for anxiety & Trintellix 20 mg for depression. She received other significant medication regimen for the other pre-existing medical issues presented. She tolerated her treatment regimen without any adverse effects or reactions reported. Barbara Forbes was enrolled & participated in the group counseling sessions being offered & held on this unit. She learned coping skills.  Barbara Forbes's symptoms responded well to her treatment regimen. This is evidenced by her reports of improved symptoms, absence of hallucinations, delusions & or paranoia. Upon discharge, she denies SIHI. She is currently stable to be discharged to her place of residence to continue mental health care on an outpatient basis as noted below. She is provided with all the necessary information needed to make this appointment without problems. She left [facility] with all personal belongs in no apparent distress.  Physical Findings: AIMS:  , ,  ,  ,    CIWA:    COWS:     Musculoskeletal: Strength &  Muscle Tone: within normal limits Gait & Station: normal Patient leans: N/A  Psychiatric Specialty Exam: Physical Exam  Nursing note and vitals reviewed. Constitutional: She appears well-developed.  HENT:  Head: Normocephalic.  Eyes: Pupils are equal, round, and reactive to light.  Neck: Normal range of motion.  Cardiovascular: Normal rate.  Respiratory: Effort normal.  GI: Soft.  Genitourinary:  Genitourinary Comments: Deferred  Musculoskeletal: Normal range of motion.  Neurological: She is alert.  Skin: Skin is warm.    Review of Systems  Constitutional: Negative.   HENT: Negative.   Eyes: Negative.   Respiratory: Negative.  Negative for  cough.   Cardiovascular: Negative.  Negative for chest pain and palpitations.  Gastrointestinal: Negative.  Negative for abdominal pain, heartburn, nausea and vomiting.  Genitourinary: Negative.   Musculoskeletal: Negative.   Skin: Negative.   Neurological: Negative.   Endo/Heme/Allergies: Negative.   Psychiatric/Behavioral: Positive for depression (Stable) and hallucinations (Hx. Psychosis (Stable) ). Negative for memory loss, substance abuse and suicidal ideas. The patient has insomnia ( Stable). The patient is not nervous/anxious (Stable).     Blood pressure 116/67, pulse (!) 122, temperature 98.7 F (37.1 C), temperature source Oral, resp. rate 18, height 5' 1.5" (1.562 m), weight 69.4 kg.Body mass index is 28.44 kg/m.  See Md's discharge SRA   Have you used any form of tobacco in the last 30 days? (Cigarettes, Smokeless Tobacco, Cigars, and/or Pipes): No  Has this patient used any form of tobacco in the last 30 days? (Cigarettes, Smokeless Tobacco, Cigars, and/or Pipes): N/A  Blood Alcohol level:  Lab Results  Component Value Date   ETH <10 91/63/8466   Metabolic Disorder Labs:  No results found for: HGBA1C, MPG No results found for: PROLACTIN No results found for: CHOL, TRIG, HDL, CHOLHDL, VLDL, LDLCALC  See Psychiatric  Specialty Exam and Suicide Risk Assessment completed by Attending Physician prior to discharge.  Discharge destination:  Home  Is patient on multiple antipsychotic therapies at discharge:  No   Has Patient had three or more failed trials of antipsychotic monotherapy by history:  No  Recommended Plan for Multiple Antipsychotic Therapies: NA  Allergies as of 06/17/2018      Reactions   Penicillins Rash   Has patient had a PCN reaction causing immediate rash, facial/tongue/throat swelling, SOB or lightheadedness with hypotension: Yes Has patient had a PCN reaction causing severe rash involving mucus membranes or skin necrosis: No Has patient had a PCN reaction that required hospitalization: Unknown Has patient had a PCN reaction occurring within the last 10 years: No If all of the above answers are "NO", then may proceed with Cephalosporin use.   Sulfa Antibiotics Rash   Sulfamethoxazole Rash      Medication List    STOP taking these medications   sertraline 100 MG tablet Commonly known as:  ZOLOFT     TAKE these medications     Indication  ARIPiprazole 2 MG tablet Commonly known as:  ABILIFY Take 1 tablet (2 mg total) by mouth daily. For mood control Start taking on:  06/18/2018  Indication:  Mood control   clonazePAM 0.5 MG tablet Commonly known as:  KLONOPIN Take 1 tablet (0.5 mg total) by mouth 3 (three) times daily. For anxiety  Indication:  Anxiety   famotidine 20 MG tablet Commonly known as:  PEPCID Take 1 tablet (20 mg total) by mouth 2 (two) times daily. For acid reflux  Indication:  Gastroesophageal Reflux Disease   hydrOXYzine 25 MG tablet Commonly known as:  ATARAX/VISTARIL Take 1 tablet (25 mg total) by mouth 3 (three) times daily as needed for anxiety (insomnia).  Indication:  Feeling Anxious, Insomnia   insulin aspart 100 UNIT/ML injection Commonly known as:  novoLOG Inject 0-15 Units into the skin 3 (three) times daily before meals. Per sliding scale  coverage for diabetes management What changed:  additional instructions  Indication:  Type 2 Diabetes   insulin degludec 100 UNIT/ML Sopn FlexTouch Pen Commonly known as:  TRESIBA Inject 0.22 mLs (22 Units total) into the skin daily. For diabetes management What changed:  additional instructions  Indication:  Type 2 Diabetes  insulin glargine 100 UNIT/ML injection Commonly known as:  LANTUS Inject 0.22 mLs (22 Units total) into the skin daily. For diabetes management Start taking on:  06/18/2018  Indication:  Type 2 Diabetes   levothyroxine 88 MCG tablet Commonly known as:  SYNTHROID, LEVOTHROID Take 1 tablet (88 mcg total) by mouth daily before breakfast. For hormone replacement What changed:  additional instructions  Indication:  Underactive Thyroid   losartan 100 MG tablet Commonly known as:  COZAAR Take 1 tablet (100 mg total) by mouth daily. For high blood pressure What changed:  additional instructions  Indication:  High Blood Pressure Disorder   pravastatin 80 MG tablet Commonly known as:  PRAVACHOL Take 1 tablet (80 mg total) by mouth daily. For high cholesterol What changed:  additional instructions  Indication:  Inherited Heterozygous Hypercholesterolemia, Type II B Hyperlipidemia   traZODone 50 MG tablet Commonly known as:  DESYREL Take 1 tablet (50 mg total) by mouth at bedtime as needed for sleep.  Indication:  Trouble Sleeping   vortioxetine HBr 20 MG Tabs tablet Commonly known as:  TRINTELLIX Take 1 tablet (20 mg total) by mouth daily. For depression Start taking on:  06/18/2018  Indication:  Major Depressive Disorder      Follow-up [place]. Go on 06/19/2018.   Why:  Please attend your therapy appointment on Wednesday, 06/19/18 at 12:00p.m. Contact information: [address] [place] [address] phone: [phone] fax: [phone]         Follow-up recommendations: Activity:  As  tolerated Diet: As recommended by your primary care doctor. Keep all scheduled follow-up appointments as recommended.  Comments: Patient is instructed prior to discharge to: Take all medications as prescribed by his/her mental healthcare provider. Report any adverse effects and or reactions from the medicines to his/her outpatient provider promptly. Patient has been instructed & cautioned: To not engage in alcohol and or illegal drug use while on prescription medicines. In the event of worsening symptoms, patient is instructed to call the crisis hotline, 911 and or go to the nearest ED for appropriate evaluation and treatment of symptoms. To follow-up with his/her primary care provider for your other medical issues, concerns and or health care needs.   Signed: Lindell Spar, NP, PMHNP, FNP-BC 06/17/2018, 9:48 AM

## 2018-06-19 DIAGNOSIS — F4321 Adjustment disorder with depressed mood: Secondary | ICD-10-CM | POA: Diagnosis not present

## 2018-06-28 DIAGNOSIS — F2081 Schizophreniform disorder: Secondary | ICD-10-CM | POA: Diagnosis not present

## 2018-07-11 DIAGNOSIS — R1032 Left lower quadrant pain: Secondary | ICD-10-CM | POA: Diagnosis not present

## 2018-08-20 DIAGNOSIS — F2081 Schizophreniform disorder: Secondary | ICD-10-CM | POA: Diagnosis not present

## 2018-08-30 DIAGNOSIS — Z794 Long term (current) use of insulin: Secondary | ICD-10-CM | POA: Diagnosis not present

## 2018-08-30 DIAGNOSIS — E1065 Type 1 diabetes mellitus with hyperglycemia: Secondary | ICD-10-CM | POA: Diagnosis not present

## 2018-08-30 DIAGNOSIS — E042 Nontoxic multinodular goiter: Secondary | ICD-10-CM | POA: Diagnosis not present

## 2018-09-03 DIAGNOSIS — F2081 Schizophreniform disorder: Secondary | ICD-10-CM | POA: Diagnosis not present

## 2018-10-02 DIAGNOSIS — F2081 Schizophreniform disorder: Secondary | ICD-10-CM | POA: Diagnosis not present

## 2018-11-29 DIAGNOSIS — E1065 Type 1 diabetes mellitus with hyperglycemia: Secondary | ICD-10-CM | POA: Diagnosis not present

## 2018-11-29 DIAGNOSIS — E042 Nontoxic multinodular goiter: Secondary | ICD-10-CM | POA: Diagnosis not present

## 2018-11-29 DIAGNOSIS — Z794 Long term (current) use of insulin: Secondary | ICD-10-CM | POA: Diagnosis not present

## 2018-12-11 DIAGNOSIS — F200 Paranoid schizophrenia: Secondary | ICD-10-CM | POA: Diagnosis not present

## 2019-03-06 DIAGNOSIS — E1065 Type 1 diabetes mellitus with hyperglycemia: Secondary | ICD-10-CM | POA: Diagnosis not present

## 2019-03-06 DIAGNOSIS — E042 Nontoxic multinodular goiter: Secondary | ICD-10-CM | POA: Diagnosis not present

## 2019-03-06 DIAGNOSIS — Z794 Long term (current) use of insulin: Secondary | ICD-10-CM | POA: Diagnosis not present

## 2019-03-13 DIAGNOSIS — H2513 Age-related nuclear cataract, bilateral: Secondary | ICD-10-CM | POA: Diagnosis not present

## 2019-03-13 DIAGNOSIS — E103293 Type 1 diabetes mellitus with mild nonproliferative diabetic retinopathy without macular edema, bilateral: Secondary | ICD-10-CM | POA: Diagnosis not present

## 2019-03-13 DIAGNOSIS — H4321 Crystalline deposits in vitreous body, right eye: Secondary | ICD-10-CM | POA: Diagnosis not present

## 2019-03-31 DIAGNOSIS — E041 Nontoxic single thyroid nodule: Secondary | ICD-10-CM | POA: Diagnosis not present

## 2019-04-09 DIAGNOSIS — E109 Type 1 diabetes mellitus without complications: Secondary | ICD-10-CM | POA: Diagnosis not present

## 2019-04-09 DIAGNOSIS — K649 Unspecified hemorrhoids: Secondary | ICD-10-CM | POA: Diagnosis not present

## 2019-04-09 DIAGNOSIS — I10 Essential (primary) hypertension: Secondary | ICD-10-CM | POA: Diagnosis not present

## 2019-04-09 DIAGNOSIS — E063 Autoimmune thyroiditis: Secondary | ICD-10-CM | POA: Diagnosis not present

## 2019-04-28 DIAGNOSIS — H4321 Crystalline deposits in vitreous body, right eye: Secondary | ICD-10-CM | POA: Diagnosis not present

## 2019-04-28 DIAGNOSIS — L989 Disorder of the skin and subcutaneous tissue, unspecified: Secondary | ICD-10-CM | POA: Diagnosis not present

## 2019-04-28 DIAGNOSIS — E103293 Type 1 diabetes mellitus with mild nonproliferative diabetic retinopathy without macular edema, bilateral: Secondary | ICD-10-CM | POA: Diagnosis not present

## 2019-04-28 DIAGNOSIS — H11823 Conjunctivochalasis, bilateral: Secondary | ICD-10-CM | POA: Diagnosis not present

## 2019-04-28 DIAGNOSIS — H4312 Vitreous hemorrhage, left eye: Secondary | ICD-10-CM | POA: Diagnosis not present

## 2019-04-28 DIAGNOSIS — H2513 Age-related nuclear cataract, bilateral: Secondary | ICD-10-CM | POA: Diagnosis not present

## 2019-05-01 DIAGNOSIS — H4321 Crystalline deposits in vitreous body, right eye: Secondary | ICD-10-CM | POA: Diagnosis not present

## 2019-05-01 DIAGNOSIS — H2513 Age-related nuclear cataract, bilateral: Secondary | ICD-10-CM | POA: Diagnosis not present

## 2019-05-01 DIAGNOSIS — E103293 Type 1 diabetes mellitus with mild nonproliferative diabetic retinopathy without macular edema, bilateral: Secondary | ICD-10-CM | POA: Diagnosis not present

## 2019-05-01 DIAGNOSIS — H4312 Vitreous hemorrhage, left eye: Secondary | ICD-10-CM | POA: Diagnosis not present

## 2019-05-07 DIAGNOSIS — F200 Paranoid schizophrenia: Secondary | ICD-10-CM | POA: Diagnosis not present

## 2019-05-29 DIAGNOSIS — H2513 Age-related nuclear cataract, bilateral: Secondary | ICD-10-CM | POA: Diagnosis not present

## 2019-05-29 DIAGNOSIS — H4312 Vitreous hemorrhage, left eye: Secondary | ICD-10-CM | POA: Diagnosis not present

## 2019-05-29 DIAGNOSIS — E103293 Type 1 diabetes mellitus with mild nonproliferative diabetic retinopathy without macular edema, bilateral: Secondary | ICD-10-CM | POA: Diagnosis not present

## 2019-05-29 DIAGNOSIS — H4321 Crystalline deposits in vitreous body, right eye: Secondary | ICD-10-CM | POA: Diagnosis not present

## 2019-06-09 DIAGNOSIS — Z794 Long term (current) use of insulin: Secondary | ICD-10-CM | POA: Diagnosis not present

## 2019-06-09 DIAGNOSIS — E1065 Type 1 diabetes mellitus with hyperglycemia: Secondary | ICD-10-CM | POA: Diagnosis not present

## 2019-06-09 DIAGNOSIS — E042 Nontoxic multinodular goiter: Secondary | ICD-10-CM | POA: Diagnosis not present

## 2019-08-08 DIAGNOSIS — F200 Paranoid schizophrenia: Secondary | ICD-10-CM | POA: Diagnosis not present

## 2019-09-04 DIAGNOSIS — E103293 Type 1 diabetes mellitus with mild nonproliferative diabetic retinopathy without macular edema, bilateral: Secondary | ICD-10-CM | POA: Diagnosis not present

## 2019-09-04 DIAGNOSIS — H4312 Vitreous hemorrhage, left eye: Secondary | ICD-10-CM | POA: Diagnosis not present

## 2019-09-04 DIAGNOSIS — H2513 Age-related nuclear cataract, bilateral: Secondary | ICD-10-CM | POA: Diagnosis not present

## 2019-09-04 DIAGNOSIS — H4321 Crystalline deposits in vitreous body, right eye: Secondary | ICD-10-CM | POA: Diagnosis not present

## 2019-09-09 DIAGNOSIS — Z7189 Other specified counseling: Secondary | ICD-10-CM | POA: Diagnosis not present

## 2019-09-09 DIAGNOSIS — E1065 Type 1 diabetes mellitus with hyperglycemia: Secondary | ICD-10-CM | POA: Diagnosis not present

## 2019-09-09 DIAGNOSIS — Z794 Long term (current) use of insulin: Secondary | ICD-10-CM | POA: Diagnosis not present

## 2019-09-09 DIAGNOSIS — E042 Nontoxic multinodular goiter: Secondary | ICD-10-CM | POA: Diagnosis not present

## 2019-10-16 DIAGNOSIS — Z23 Encounter for immunization: Secondary | ICD-10-CM | POA: Diagnosis not present

## 2019-11-06 DIAGNOSIS — F200 Paranoid schizophrenia: Secondary | ICD-10-CM | POA: Diagnosis not present

## 2019-11-13 DIAGNOSIS — Z23 Encounter for immunization: Secondary | ICD-10-CM | POA: Diagnosis not present

## 2019-12-08 DIAGNOSIS — E1065 Type 1 diabetes mellitus with hyperglycemia: Secondary | ICD-10-CM | POA: Diagnosis not present

## 2019-12-08 DIAGNOSIS — E042 Nontoxic multinodular goiter: Secondary | ICD-10-CM | POA: Diagnosis not present

## 2019-12-08 DIAGNOSIS — Z794 Long term (current) use of insulin: Secondary | ICD-10-CM | POA: Diagnosis not present

## 2020-02-03 DIAGNOSIS — F200 Paranoid schizophrenia: Secondary | ICD-10-CM | POA: Diagnosis not present

## 2020-02-10 ENCOUNTER — Telehealth: Payer: Self-pay | Admitting: Nutrition

## 2020-02-16 ENCOUNTER — Other Ambulatory Visit: Payer: Self-pay

## 2020-02-16 ENCOUNTER — Encounter: Payer: BC Managed Care – PPO | Attending: Internal Medicine | Admitting: Nutrition

## 2020-02-16 DIAGNOSIS — E1065 Type 1 diabetes mellitus with hyperglycemia: Secondary | ICD-10-CM | POA: Insufficient documentation

## 2020-02-16 NOTE — Progress Notes (Signed)
Patient reports that she was started on the Dash by the [facility] trainer, Irven Baltimore about 3 weeks ago, and changes were made to her settings since them by the trainer. But she says that the settings are "not right".  She says she is needing to put in more carbs that she is eating for it to give her more insulin.  Says FBSs are 150--180s  Today FBS 220--because of a HS snack with no bolus.  acL:,acS, and HS: usually 150s. Says she does not want to increase basal insulin to bring down FBS at this time, despite my advice of increasing basal from 4AM to 7AM by 0.05.  I will revisit this in one week. She appears to be counting carbs accurately and is adding 15-20 carbs more that she is eating, and still going high.  Bfast carbs are usually 45-60, lunch 30-40, and supper: 30.  (She has started a diet X5 weeks and has lost 7 pounds).   We calculated new carb settings based on what she is eating, and what she is putting in, to get the new I/C ratio. Current pump settings:  Basal rate: 0.7u/hr., ISF: 62, I/C: 8, Target 120,  with correction over 140.   New settings:  Basal rate: 0.7 ISF: MN: 5,  10AM: 7, 2PM: 6.6.  Target: 120 with correction over 120.   Plan: We discussed how to can tell if I/C ratio is correct, and she will keep a 1 week record of blood sugars and Insulin doses, and testing/scanning Libre ac and HS.   She will call me readings in one week. And I will review Elenor Legato to evaluate pump settings at that time.

## 2020-02-16 NOTE — Patient Instructions (Addendum)
Keep log book of blood sugars before meals and at bedtime, along with insulin dose.  Call readings to me in one week. Try to eat low fat meals.  Keep a log of when you are doing correction doses, and if this brings blood sugars down to 120.

## 2020-02-24 NOTE — Telephone Encounter (Signed)
Opened in error

## 2020-03-02 ENCOUNTER — Telehealth: Payer: Self-pay | Admitting: Nutrition

## 2020-03-02 NOTE — Telephone Encounter (Signed)
"  I don't think I am getting enough insulin to cover my meals".  I usually will take 2 more units on my old pump at breakfast, one more at lunch and supper. And it is not giving me enough to correct when I am high.  It will only bring it down to about 160.   Says FBSs are 180-low 200s, acL 130-180, acS: 150-180, HS: over 150-190.  Says is bolusing for all meals and snacks.   Plan: decrease carb ratio:  Bfast: 5 to 4 (this will give her 2 more units), acL: 7-6 (this will give her 1 more unit), acS: 6.6 to 6.0 Plan: call in one week.  Call sooner if dropping low.

## 2020-03-03 ENCOUNTER — Telehealth: Payer: Self-pay | Admitting: Nutrition

## 2020-03-03 NOTE — Telephone Encounter (Signed)
Message left on my answering machine that blood sugars are still running high, and that maybe there may be a problem with the pod.   I returned the call at [facility] and was not able to talk with her.  Message left on her machine that if she thinks it is a pod problem, she needs to change the pod ASAP.   Also to call Dr. Cindra Eves office if, after changing the pod out, the blood sugars do not come down.

## 2020-03-04 DIAGNOSIS — E103293 Type 1 diabetes mellitus with mild nonproliferative diabetic retinopathy without macular edema, bilateral: Secondary | ICD-10-CM | POA: Diagnosis not present

## 2020-03-04 DIAGNOSIS — H2513 Age-related nuclear cataract, bilateral: Secondary | ICD-10-CM | POA: Diagnosis not present

## 2020-03-04 DIAGNOSIS — H4312 Vitreous hemorrhage, left eye: Secondary | ICD-10-CM | POA: Diagnosis not present

## 2020-03-04 DIAGNOSIS — H4321 Crystalline deposits in vitreous body, right eye: Secondary | ICD-10-CM | POA: Diagnosis not present

## 2020-03-10 ENCOUNTER — Encounter: Payer: BC Managed Care – PPO | Attending: Internal Medicine | Admitting: Nutrition

## 2020-03-10 ENCOUNTER — Other Ambulatory Visit: Payer: Self-pay

## 2020-03-10 DIAGNOSIS — Z794 Long term (current) use of insulin: Secondary | ICD-10-CM | POA: Diagnosis not present

## 2020-03-10 DIAGNOSIS — E1065 Type 1 diabetes mellitus with hyperglycemia: Secondary | ICD-10-CM | POA: Diagnosis not present

## 2020-03-10 DIAGNOSIS — E042 Nontoxic multinodular goiter: Secondary | ICD-10-CM | POA: Diagnosis not present

## 2020-03-11 NOTE — Patient Instructions (Signed)
Use extended bolus for high fat meals--add 1 extra unit of insulin and extend the 50% of bolus over 4 hours\ Use upper and lower buttock areas for pod placement.  Rotate sites as directed Massage abdominal scare tissue areas for 15-20 minutes nightly for 3-4 months, before using these areas for pod placement.

## 2020-03-11 NOTE — Progress Notes (Addendum)
Patient is here today because she phoned me saying she is not sure she wants to stay on this pump.  She is having high blood sugars and is feeling like it is pod placement problems, and says sites can not be used due to scaring.   She reports today that she is using leg sites and for some reason, all blood sugars have been very good for the last 4 days.  Download shows this.  Says sites have not changed.  Is still using leg areas.   Discussed other areas on upper ad side buttocks areas that are not scared from insulin injections.  She was told to get a bottle of lotion and to massage hardened areas for 10-15 min. In the eventings when watching TV, to see if this will help to break down the scaring.  She agreed to do this as well.  Says she wants to continue this pump for now and we reviewed how/when to use the extended bolus, and how to use the IOB to determine how much carbohydrate is needed if blood sugars drop low. She reports that changes to the carb ratio that were made at previous visits are working well with only one low, that was her fault, for not eating as many carbs as she entered. IC: 4 acB and 6 acL and supper.  Basal rate: 0.7 She says weight loss is progressing, and has now lost 17 pounds.  Praised her for this and encouraged exercise as an importance part of this process.  Suggested starting at 20 min. 4X/wk, and to work up to 40 min. 5X/week.  She had no final questions.

## 2020-03-16 DIAGNOSIS — Z794 Long term (current) use of insulin: Secondary | ICD-10-CM | POA: Diagnosis not present

## 2020-03-16 DIAGNOSIS — E1065 Type 1 diabetes mellitus with hyperglycemia: Secondary | ICD-10-CM | POA: Diagnosis not present

## 2020-03-16 DIAGNOSIS — E042 Nontoxic multinodular goiter: Secondary | ICD-10-CM | POA: Diagnosis not present

## 2020-03-19 ENCOUNTER — Telehealth: Payer: Self-pay | Admitting: Dietician

## 2020-04-19 ENCOUNTER — Other Ambulatory Visit: Payer: Self-pay | Admitting: Obstetrics & Gynecology

## 2020-04-19 DIAGNOSIS — Z1231 Encounter for screening mammogram for malignant neoplasm of breast: Secondary | ICD-10-CM

## 2020-05-05 ENCOUNTER — Ambulatory Visit
Admission: RE | Admit: 2020-05-05 | Discharge: 2020-05-05 | Disposition: A | Discharge status: Home / Self Care | Payer: BC Managed Care – PPO | Source: Ambulatory Visit | Attending: Obstetrics & Gynecology | Admitting: Obstetrics & Gynecology

## 2020-05-05 ENCOUNTER — Other Ambulatory Visit: Payer: Self-pay

## 2020-05-05 DIAGNOSIS — E109 Type 1 diabetes mellitus without complications: Secondary | ICD-10-CM | POA: Diagnosis not present

## 2020-05-05 DIAGNOSIS — E063 Autoimmune thyroiditis: Secondary | ICD-10-CM | POA: Diagnosis not present

## 2020-05-05 DIAGNOSIS — Z23 Encounter for immunization: Secondary | ICD-10-CM | POA: Diagnosis not present

## 2020-05-05 DIAGNOSIS — Z6832 Body mass index (BMI) 32.0-32.9, adult: Secondary | ICD-10-CM | POA: Diagnosis not present

## 2020-05-05 DIAGNOSIS — Z1231 Encounter for screening mammogram for malignant neoplasm of breast: Secondary | ICD-10-CM | POA: Diagnosis not present

## 2020-06-02 DIAGNOSIS — F200 Paranoid schizophrenia: Secondary | ICD-10-CM | POA: Diagnosis not present

## 2020-06-10 DIAGNOSIS — E042 Nontoxic multinodular goiter: Secondary | ICD-10-CM | POA: Diagnosis not present

## 2020-06-10 DIAGNOSIS — E1065 Type 1 diabetes mellitus with hyperglycemia: Secondary | ICD-10-CM | POA: Diagnosis not present

## 2020-06-10 DIAGNOSIS — Z794 Long term (current) use of insulin: Secondary | ICD-10-CM | POA: Diagnosis not present

## 2020-06-10 DIAGNOSIS — K3184 Gastroparesis: Secondary | ICD-10-CM | POA: Diagnosis not present

## 2020-06-16 ENCOUNTER — Ambulatory Visit: Payer: BC Managed Care – PPO | Attending: Internal Medicine

## 2020-06-16 DIAGNOSIS — Z23 Encounter for immunization: Secondary | ICD-10-CM

## 2020-06-16 NOTE — Progress Notes (Signed)
   [facility]  Name:  Barbara Forbes    MRN: [id number] DOB: 10-03-1960  06/16/2020  [name] was observed post Covid-19 immunization for 15 minutes without incident. She was provided with Vaccine Information Sheet and instruction to access the V-Safe system.   [name] was instructed to call 911 with any severe reactions post vaccine: Marland Kitchen Difficulty breathing  . Swelling of face and throat  . A fast heartbeat  . A bad rash all over body  . Dizziness and weakness   Immunizations Administered    No immunizations on file.

## 2020-06-17 ENCOUNTER — Other Ambulatory Visit (HOSPITAL_COMMUNITY): Payer: Self-pay | Admitting: Internal Medicine

## 2020-06-23 DIAGNOSIS — E041 Nontoxic single thyroid nodule: Secondary | ICD-10-CM | POA: Diagnosis not present

## 2020-07-20 DIAGNOSIS — E042 Nontoxic multinodular goiter: Secondary | ICD-10-CM | POA: Diagnosis not present

## 2020-07-20 DIAGNOSIS — I10 Essential (primary) hypertension: Secondary | ICD-10-CM | POA: Diagnosis not present

## 2020-07-20 DIAGNOSIS — E1065 Type 1 diabetes mellitus with hyperglycemia: Secondary | ICD-10-CM | POA: Diagnosis not present

## 2020-07-20 DIAGNOSIS — E785 Hyperlipidemia, unspecified: Secondary | ICD-10-CM | POA: Diagnosis not present

## 2020-08-17 DIAGNOSIS — F200 Paranoid schizophrenia: Secondary | ICD-10-CM | POA: Diagnosis not present

## 2020-09-10 DIAGNOSIS — L814 Other melanin hyperpigmentation: Secondary | ICD-10-CM | POA: Diagnosis not present

## 2020-09-10 DIAGNOSIS — D225 Melanocytic nevi of trunk: Secondary | ICD-10-CM | POA: Diagnosis not present

## 2020-09-10 DIAGNOSIS — L821 Other seborrheic keratosis: Secondary | ICD-10-CM | POA: Diagnosis not present

## 2020-09-10 DIAGNOSIS — L820 Inflamed seborrheic keratosis: Secondary | ICD-10-CM | POA: Diagnosis not present

## 2020-09-15 DIAGNOSIS — F200 Paranoid schizophrenia: Secondary | ICD-10-CM | POA: Diagnosis not present

## 2020-09-20 DIAGNOSIS — E1065 Type 1 diabetes mellitus with hyperglycemia: Secondary | ICD-10-CM | POA: Diagnosis not present

## 2020-09-20 DIAGNOSIS — Z794 Long term (current) use of insulin: Secondary | ICD-10-CM | POA: Diagnosis not present

## 2020-09-20 DIAGNOSIS — E042 Nontoxic multinodular goiter: Secondary | ICD-10-CM | POA: Diagnosis not present

## 2020-09-20 DIAGNOSIS — K3184 Gastroparesis: Secondary | ICD-10-CM | POA: Diagnosis not present

## 2020-11-10 DIAGNOSIS — F200 Paranoid schizophrenia: Secondary | ICD-10-CM | POA: Diagnosis not present

## 2021-01-18 DIAGNOSIS — K3184 Gastroparesis: Secondary | ICD-10-CM | POA: Diagnosis not present

## 2021-01-18 DIAGNOSIS — Z794 Long term (current) use of insulin: Secondary | ICD-10-CM | POA: Diagnosis not present

## 2021-01-18 DIAGNOSIS — E1065 Type 1 diabetes mellitus with hyperglycemia: Secondary | ICD-10-CM | POA: Diagnosis not present

## 2021-01-18 DIAGNOSIS — E042 Nontoxic multinodular goiter: Secondary | ICD-10-CM | POA: Diagnosis not present

## 2021-01-28 DIAGNOSIS — E042 Nontoxic multinodular goiter: Secondary | ICD-10-CM | POA: Diagnosis not present

## 2021-01-28 DIAGNOSIS — E1065 Type 1 diabetes mellitus with hyperglycemia: Secondary | ICD-10-CM | POA: Diagnosis not present

## 2021-01-28 DIAGNOSIS — E039 Hypothyroidism, unspecified: Secondary | ICD-10-CM | POA: Diagnosis not present

## 2021-01-28 DIAGNOSIS — E785 Hyperlipidemia, unspecified: Secondary | ICD-10-CM | POA: Diagnosis not present

## 2021-02-02 DIAGNOSIS — F200 Paranoid schizophrenia: Secondary | ICD-10-CM | POA: Diagnosis not present

## 2021-03-17 DIAGNOSIS — H4321 Crystalline deposits in vitreous body, right eye: Secondary | ICD-10-CM | POA: Diagnosis not present

## 2021-03-17 DIAGNOSIS — E103293 Type 1 diabetes mellitus with mild nonproliferative diabetic retinopathy without macular edema, bilateral: Secondary | ICD-10-CM | POA: Diagnosis not present

## 2021-03-17 DIAGNOSIS — H4312 Vitreous hemorrhage, left eye: Secondary | ICD-10-CM | POA: Diagnosis not present

## 2021-03-17 DIAGNOSIS — H2513 Age-related nuclear cataract, bilateral: Secondary | ICD-10-CM | POA: Diagnosis not present

## 2021-04-21 DIAGNOSIS — K3184 Gastroparesis: Secondary | ICD-10-CM | POA: Diagnosis not present

## 2021-04-21 DIAGNOSIS — E1065 Type 1 diabetes mellitus with hyperglycemia: Secondary | ICD-10-CM | POA: Diagnosis not present

## 2021-04-21 DIAGNOSIS — Z794 Long term (current) use of insulin: Secondary | ICD-10-CM | POA: Diagnosis not present

## 2021-04-21 DIAGNOSIS — E042 Nontoxic multinodular goiter: Secondary | ICD-10-CM | POA: Diagnosis not present

## 2021-04-27 DIAGNOSIS — F200 Paranoid schizophrenia: Secondary | ICD-10-CM | POA: Diagnosis not present

## 2021-05-05 DIAGNOSIS — Z23 Encounter for immunization: Secondary | ICD-10-CM | POA: Diagnosis not present

## 2021-05-12 ENCOUNTER — Ambulatory Visit: Payer: BC Managed Care – PPO | Admitting: Podiatry

## 2021-05-12 DIAGNOSIS — Z1231 Encounter for screening mammogram for malignant neoplasm of breast: Secondary | ICD-10-CM | POA: Diagnosis not present

## 2021-05-25 DIAGNOSIS — F200 Paranoid schizophrenia: Secondary | ICD-10-CM | POA: Diagnosis not present

## 2021-06-20 DIAGNOSIS — E1065 Type 1 diabetes mellitus with hyperglycemia: Secondary | ICD-10-CM | POA: Diagnosis not present

## 2021-06-20 DIAGNOSIS — A09 Infectious gastroenteritis and colitis, unspecified: Secondary | ICD-10-CM | POA: Diagnosis not present

## 2021-06-20 DIAGNOSIS — K3184 Gastroparesis: Secondary | ICD-10-CM | POA: Diagnosis not present

## 2021-06-20 DIAGNOSIS — E103293 Type 1 diabetes mellitus with mild nonproliferative diabetic retinopathy without macular edema, bilateral: Secondary | ICD-10-CM | POA: Diagnosis not present

## 2021-08-25 DIAGNOSIS — F200 Paranoid schizophrenia: Secondary | ICD-10-CM | POA: Diagnosis not present

## 2021-09-13 DIAGNOSIS — K3184 Gastroparesis: Secondary | ICD-10-CM | POA: Diagnosis not present

## 2021-09-13 DIAGNOSIS — Z794 Long term (current) use of insulin: Secondary | ICD-10-CM | POA: Diagnosis not present

## 2021-09-13 DIAGNOSIS — E042 Nontoxic multinodular goiter: Secondary | ICD-10-CM | POA: Diagnosis not present

## 2021-09-13 DIAGNOSIS — E1065 Type 1 diabetes mellitus with hyperglycemia: Secondary | ICD-10-CM | POA: Diagnosis not present

## 2021-12-15 DIAGNOSIS — E1065 Type 1 diabetes mellitus with hyperglycemia: Secondary | ICD-10-CM | POA: Diagnosis not present

## 2021-12-15 DIAGNOSIS — E042 Nontoxic multinodular goiter: Secondary | ICD-10-CM | POA: Diagnosis not present

## 2021-12-15 DIAGNOSIS — Z794 Long term (current) use of insulin: Secondary | ICD-10-CM | POA: Diagnosis not present

## 2021-12-15 DIAGNOSIS — K3184 Gastroparesis: Secondary | ICD-10-CM | POA: Diagnosis not present

## 2022-02-09 DIAGNOSIS — F200 Paranoid schizophrenia: Secondary | ICD-10-CM | POA: Diagnosis not present

## 2022-02-15 DIAGNOSIS — Z124 Encounter for screening for malignant neoplasm of cervix: Secondary | ICD-10-CM | POA: Diagnosis not present

## 2022-02-15 DIAGNOSIS — E663 Overweight: Secondary | ICD-10-CM | POA: Diagnosis not present

## 2022-02-15 DIAGNOSIS — E042 Nontoxic multinodular goiter: Secondary | ICD-10-CM | POA: Diagnosis not present

## 2022-02-15 DIAGNOSIS — Z13220 Encounter for screening for lipoid disorders: Secondary | ICD-10-CM | POA: Diagnosis not present

## 2022-02-15 DIAGNOSIS — Z0000 Encounter for general adult medical examination without abnormal findings: Secondary | ICD-10-CM | POA: Diagnosis not present

## 2022-02-15 DIAGNOSIS — Z6828 Body mass index (BMI) 28.0-28.9, adult: Secondary | ICD-10-CM | POA: Diagnosis not present

## 2022-02-22 DIAGNOSIS — Z1211 Encounter for screening for malignant neoplasm of colon: Secondary | ICD-10-CM | POA: Diagnosis not present

## 2022-02-22 DIAGNOSIS — Z1212 Encounter for screening for malignant neoplasm of rectum: Secondary | ICD-10-CM | POA: Diagnosis not present

## 2022-03-30 DIAGNOSIS — H4312 Vitreous hemorrhage, left eye: Secondary | ICD-10-CM | POA: Diagnosis not present

## 2022-03-30 DIAGNOSIS — E103293 Type 1 diabetes mellitus with mild nonproliferative diabetic retinopathy without macular edema, bilateral: Secondary | ICD-10-CM | POA: Diagnosis not present

## 2022-03-30 DIAGNOSIS — H4321 Crystalline deposits in vitreous body, right eye: Secondary | ICD-10-CM | POA: Diagnosis not present

## 2022-03-30 DIAGNOSIS — H2513 Age-related nuclear cataract, bilateral: Secondary | ICD-10-CM | POA: Diagnosis not present

## 2022-04-28 DIAGNOSIS — Z23 Encounter for immunization: Secondary | ICD-10-CM | POA: Diagnosis not present

## 2022-06-05 DIAGNOSIS — E041 Nontoxic single thyroid nodule: Secondary | ICD-10-CM | POA: Diagnosis not present

## 2022-06-05 DIAGNOSIS — M25511 Pain in right shoulder: Secondary | ICD-10-CM | POA: Diagnosis not present

## 2022-06-05 DIAGNOSIS — M25512 Pain in left shoulder: Secondary | ICD-10-CM | POA: Diagnosis not present

## 2022-06-15 DIAGNOSIS — Z1231 Encounter for screening mammogram for malignant neoplasm of breast: Secondary | ICD-10-CM | POA: Diagnosis not present

## 2022-06-16 DIAGNOSIS — M25512 Pain in left shoulder: Secondary | ICD-10-CM | POA: Diagnosis not present

## 2022-06-16 DIAGNOSIS — M25511 Pain in right shoulder: Secondary | ICD-10-CM | POA: Diagnosis not present

## 2022-06-27 DIAGNOSIS — Z794 Long term (current) use of insulin: Secondary | ICD-10-CM | POA: Diagnosis not present

## 2022-06-27 DIAGNOSIS — E103293 Type 1 diabetes mellitus with mild nonproliferative diabetic retinopathy without macular edema, bilateral: Secondary | ICD-10-CM | POA: Diagnosis not present

## 2022-06-27 DIAGNOSIS — I10 Essential (primary) hypertension: Secondary | ICD-10-CM | POA: Diagnosis not present

## 2022-06-27 DIAGNOSIS — E1065 Type 1 diabetes mellitus with hyperglycemia: Secondary | ICD-10-CM | POA: Diagnosis not present

## 2022-07-20 IMAGING — MG DIGITAL SCREENING BILAT W/ TOMO W/ CAD
8 series · 9 of 24 positions shown · non-contrast
Comparison: Previous exam(s).

CLINICAL DATA: Screening.

EXAM:
DIGITAL SCREENING BILATERAL MAMMOGRAM WITH TOMO AND CAD

[R CC synth-2D]
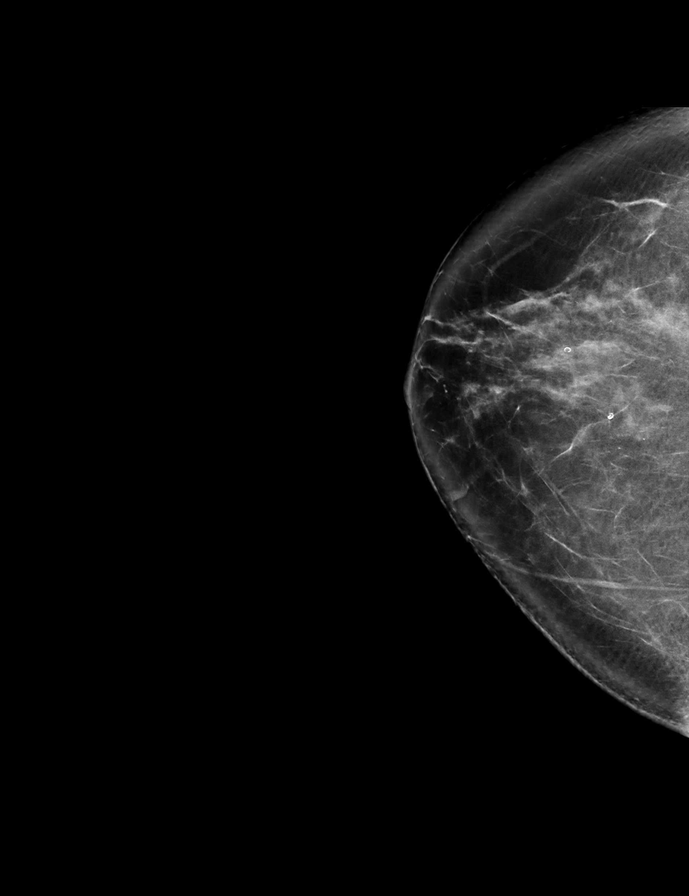

[L CC synth-2D]
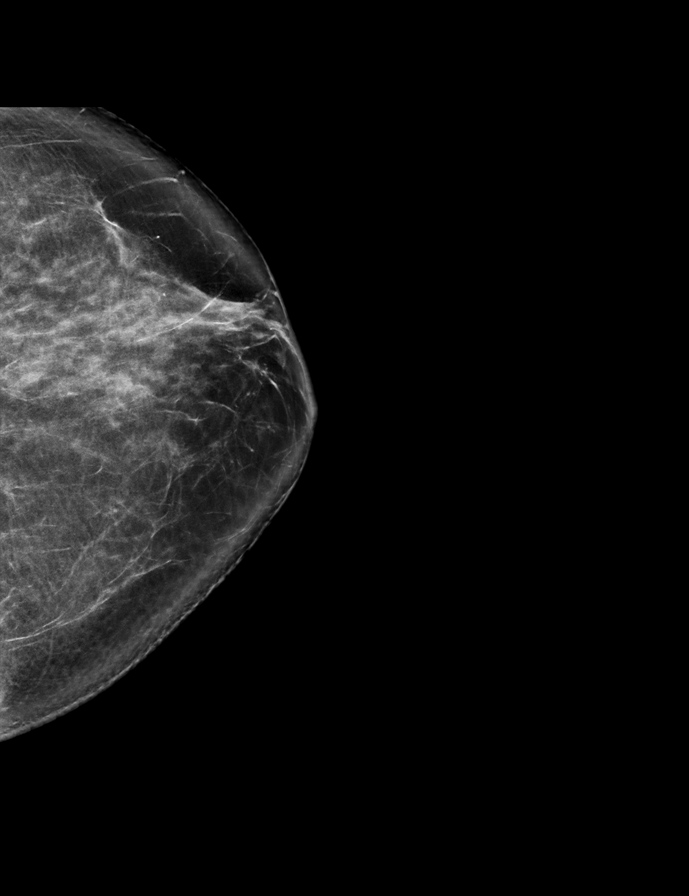

[R MLO synth-2D]
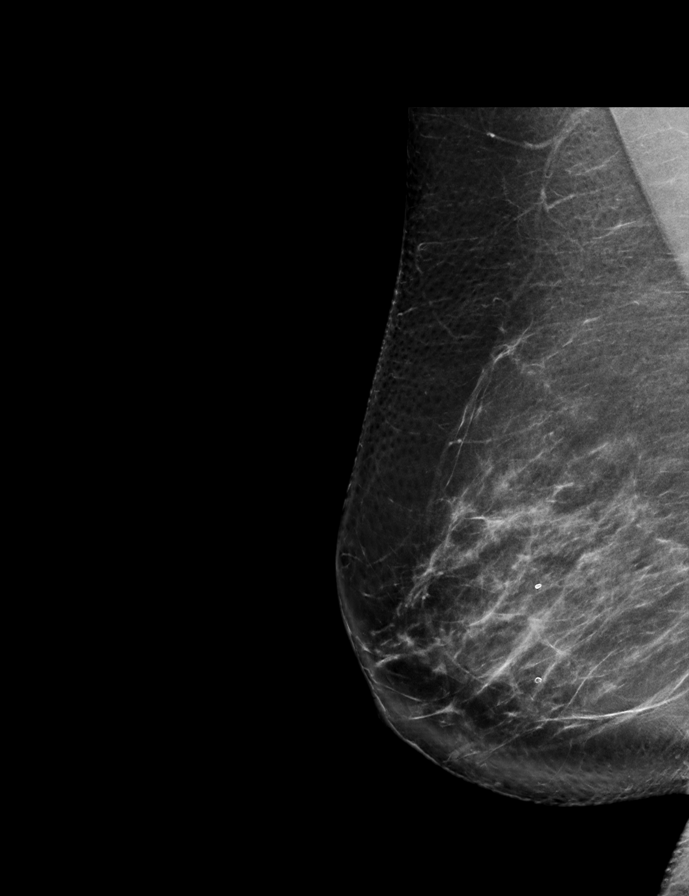

[L MLO synth-2D]
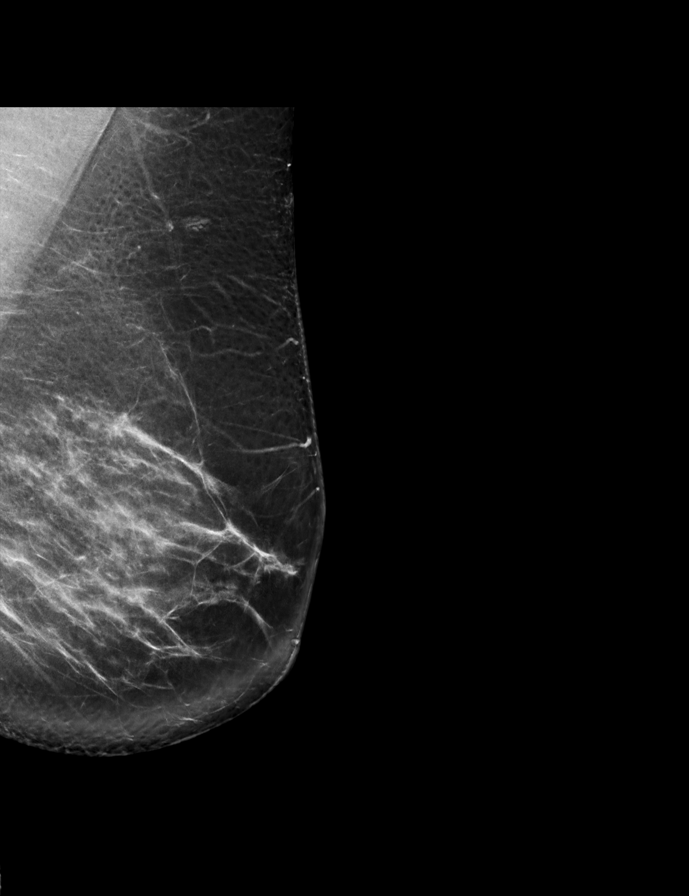

[L MLO tomo · 2 of 84 frames shown]
[frame 28/84]
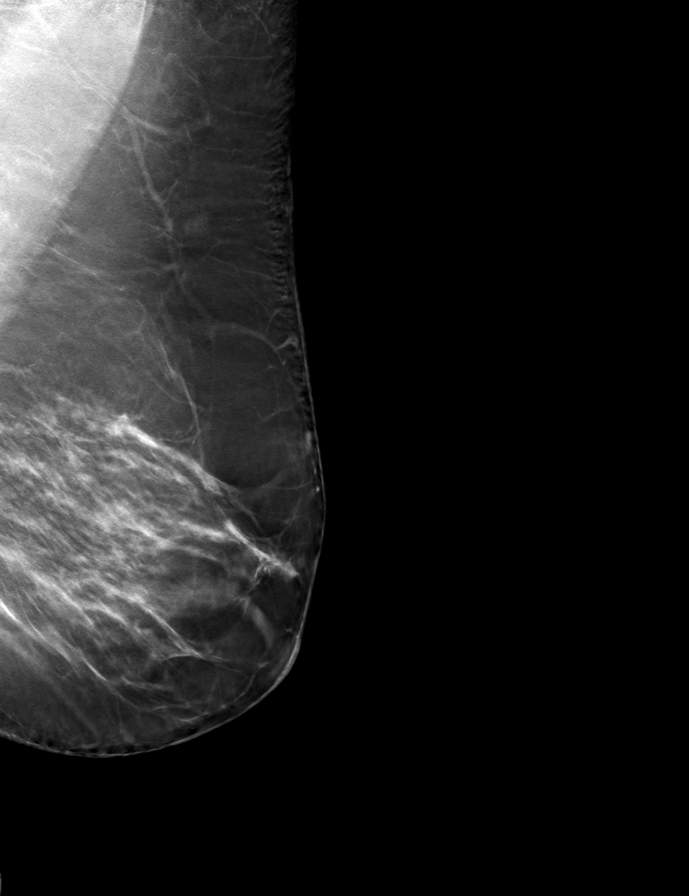
[frame 43/84]
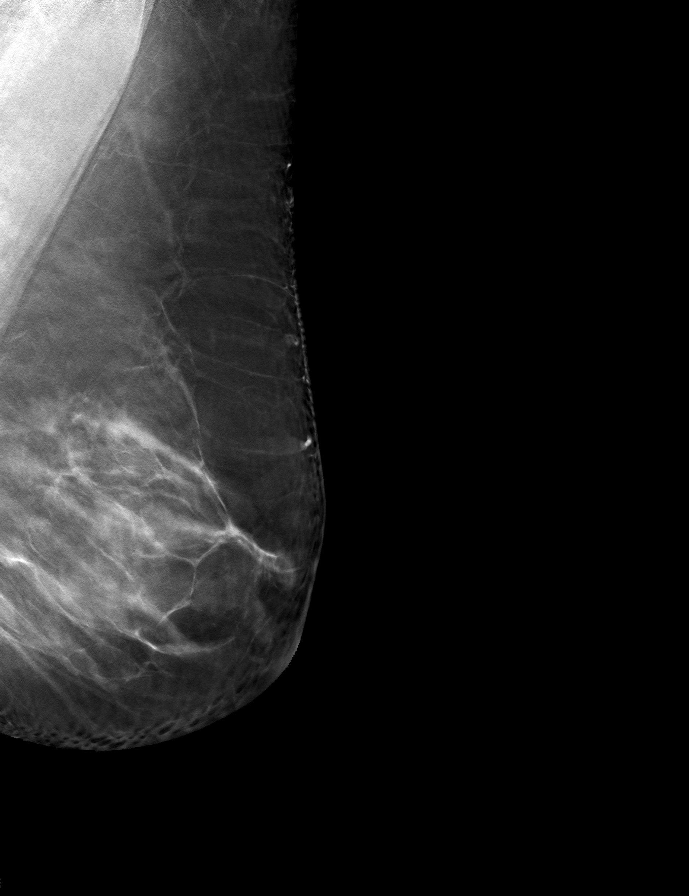

[R MLO tomo · tomo slice 42/83.0]
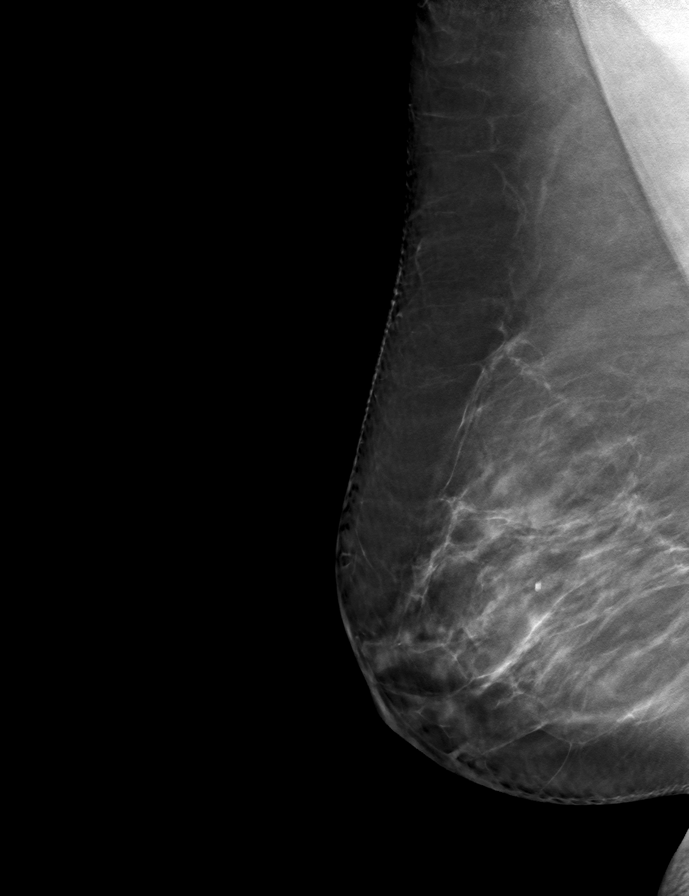

[L CC tomo · tomo slice 38/75.0]
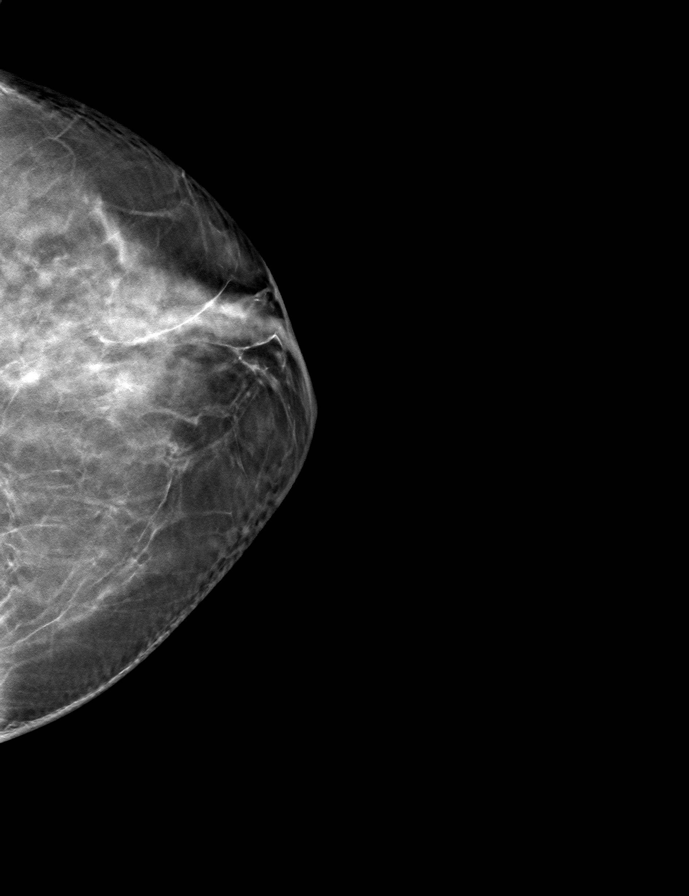

[R CC tomo · tomo slice 40/79.0]
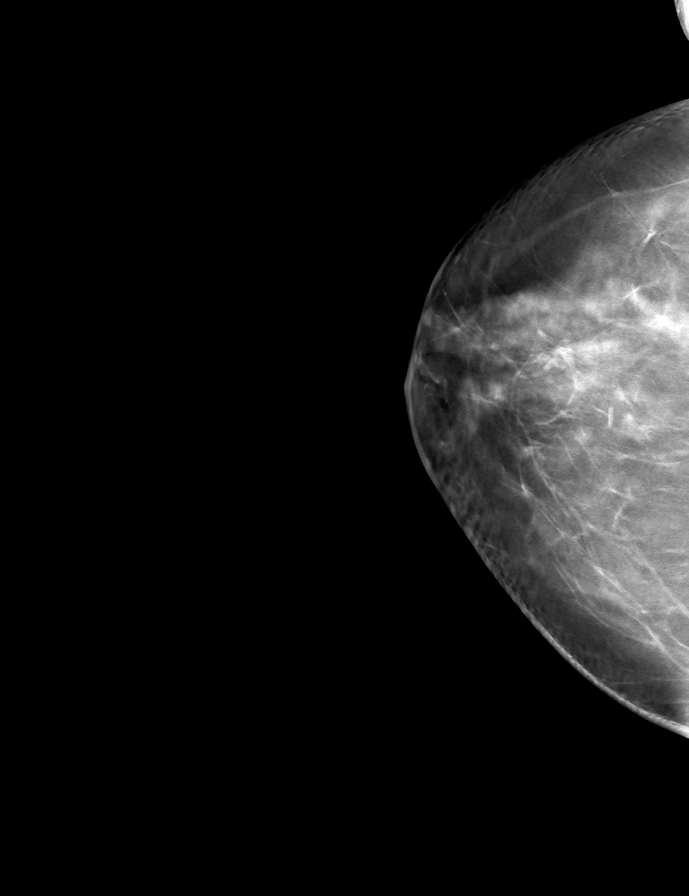

[9 of 24 positions shown; findings below may reference images not displayed]

ACR Breast Density Category c: The breast tissue is heterogeneously
dense, which may obscure small masses.
FINDINGS: There are no findings suspicious for malignancy. Images were
processed with CAD.
IMPRESSION: No mammographic evidence of malignancy. A result letter of this
screening mammogram will be mailed directly to the patient.

RECOMMENDATION:
Screening mammogram in one year. (Code:[id number])

BI-RADS CATEGORY  1: Negative.

## 2023-07-31 ENCOUNTER — Other Ambulatory Visit (HOSPITAL_BASED_OUTPATIENT_CLINIC_OR_DEPARTMENT_OTHER): Payer: Self-pay
